# Patient Record
Sex: Female | Born: 1965 | Race: White | Hispanic: No | Marital: Married | Smoking: Never smoker
Health system: Southern US, Community
[De-identification: ages and names within clinical notes are randomized; demographics above are authoritative.]

## PROBLEM LIST (undated history)

## (undated) DIAGNOSIS — F419 Anxiety disorder, unspecified: Secondary | ICD-10-CM

## (undated) DIAGNOSIS — Z789 Other specified health status: Secondary | ICD-10-CM

## (undated) HISTORY — PX: NO PAST SURGERIES: SHX2092

## (undated) HISTORY — PX: LASIK: SHX215

## (undated) HISTORY — PX: ABDOMINAL HYSTERECTOMY: SHX81

---

## 1998-09-14 ENCOUNTER — Other Ambulatory Visit: Admission: RE | Admit: 1998-09-14 | Discharge: 1998-09-14 | Payer: Self-pay | Admitting: Obstetrics and Gynecology

## 1998-11-27 ENCOUNTER — Other Ambulatory Visit: Admission: RE | Admit: 1998-11-27 | Discharge: 1998-11-27 | Payer: Self-pay | Admitting: Obstetrics and Gynecology

## 1998-11-27 ENCOUNTER — Encounter (INDEPENDENT_AMBULATORY_CARE_PROVIDER_SITE_OTHER): Payer: Self-pay | Admitting: *Deleted

## 1999-09-30 ENCOUNTER — Other Ambulatory Visit: Admission: RE | Admit: 1999-09-30 | Discharge: 1999-09-30 | Payer: Self-pay | Admitting: Obstetrics and Gynecology

## 2002-11-16 ENCOUNTER — Other Ambulatory Visit: Admission: RE | Admit: 2002-11-16 | Discharge: 2002-11-16 | Payer: Self-pay | Admitting: Obstetrics and Gynecology

## 2003-11-28 ENCOUNTER — Other Ambulatory Visit: Admission: RE | Admit: 2003-11-28 | Discharge: 2003-11-28 | Payer: Self-pay | Admitting: Obstetrics and Gynecology

## 2004-11-13 ENCOUNTER — Encounter: Admission: RE | Admit: 2004-11-13 | Discharge: 2004-11-13 | Payer: Self-pay | Admitting: Obstetrics and Gynecology

## 2005-01-15 ENCOUNTER — Other Ambulatory Visit: Admission: RE | Admit: 2005-01-15 | Discharge: 2005-01-15 | Payer: Self-pay | Admitting: Obstetrics and Gynecology

## 2006-01-16 ENCOUNTER — Encounter: Admission: RE | Admit: 2006-01-16 | Discharge: 2006-01-16 | Payer: Self-pay | Admitting: Obstetrics and Gynecology

## 2006-01-19 ENCOUNTER — Other Ambulatory Visit: Admission: RE | Admit: 2006-01-19 | Discharge: 2006-01-19 | Payer: Self-pay | Admitting: Obstetrics and Gynecology

## 2007-01-18 ENCOUNTER — Encounter: Admission: RE | Admit: 2007-01-18 | Discharge: 2007-01-18 | Payer: Self-pay | Admitting: Obstetrics and Gynecology

## 2007-01-21 ENCOUNTER — Other Ambulatory Visit: Admission: RE | Admit: 2007-01-21 | Discharge: 2007-01-21 | Payer: Self-pay | Admitting: Obstetrics and Gynecology

## 2008-01-19 ENCOUNTER — Encounter: Admission: RE | Admit: 2008-01-19 | Discharge: 2008-01-19 | Payer: Self-pay | Admitting: Obstetrics and Gynecology

## 2008-01-24 ENCOUNTER — Other Ambulatory Visit: Admission: RE | Admit: 2008-01-24 | Discharge: 2008-01-24 | Payer: Self-pay | Admitting: Obstetrics and Gynecology

## 2009-01-22 ENCOUNTER — Encounter: Admission: RE | Admit: 2009-01-22 | Discharge: 2009-01-22 | Payer: Self-pay | Admitting: Obstetrics and Gynecology

## 2009-01-24 ENCOUNTER — Other Ambulatory Visit: Admission: RE | Admit: 2009-01-24 | Discharge: 2009-01-24 | Payer: Self-pay | Admitting: Obstetrics and Gynecology

## 2010-01-23 ENCOUNTER — Encounter
Admission: RE | Admit: 2010-01-23 | Discharge: 2010-01-23 | Payer: Self-pay | Source: Home / Self Care | Admitting: Obstetrics and Gynecology

## 2010-01-24 ENCOUNTER — Other Ambulatory Visit
Admission: RE | Admit: 2010-01-24 | Discharge: 2010-01-24 | Payer: Self-pay | Source: Home / Self Care | Admitting: Obstetrics and Gynecology

## 2010-12-25 ENCOUNTER — Other Ambulatory Visit: Payer: Self-pay | Admitting: Obstetrics and Gynecology

## 2010-12-25 DIAGNOSIS — Z1231 Encounter for screening mammogram for malignant neoplasm of breast: Secondary | ICD-10-CM

## 2011-01-30 ENCOUNTER — Ambulatory Visit: Payer: Self-pay

## 2011-02-05 ENCOUNTER — Ambulatory Visit
Admission: RE | Admit: 2011-02-05 | Discharge: 2011-02-05 | Disposition: A | Discharge status: Home / Self Care | Payer: PRIVATE HEALTH INSURANCE | Source: Ambulatory Visit | Attending: Obstetrics and Gynecology | Admitting: Obstetrics and Gynecology

## 2011-02-05 DIAGNOSIS — Z1231 Encounter for screening mammogram for malignant neoplasm of breast: Secondary | ICD-10-CM

## 2011-02-06 ENCOUNTER — Other Ambulatory Visit: Payer: Self-pay | Admitting: Nurse Practitioner

## 2011-02-06 ENCOUNTER — Other Ambulatory Visit (HOSPITAL_COMMUNITY)
Admission: RE | Admit: 2011-02-06 | Discharge: 2011-02-06 | Disposition: A | Discharge status: Home / Self Care | Payer: PRIVATE HEALTH INSURANCE | Source: Ambulatory Visit | Attending: Obstetrics and Gynecology | Admitting: Obstetrics and Gynecology

## 2011-02-06 DIAGNOSIS — Z01419 Encounter for gynecological examination (general) (routine) without abnormal findings: Secondary | ICD-10-CM | POA: Insufficient documentation

## 2011-02-06 DIAGNOSIS — Z1159 Encounter for screening for other viral diseases: Secondary | ICD-10-CM | POA: Insufficient documentation

## 2011-12-30 ENCOUNTER — Other Ambulatory Visit: Payer: Self-pay | Admitting: Obstetrics and Gynecology

## 2011-12-30 DIAGNOSIS — Z1231 Encounter for screening mammogram for malignant neoplasm of breast: Secondary | ICD-10-CM

## 2012-02-06 ENCOUNTER — Ambulatory Visit: Payer: PRIVATE HEALTH INSURANCE

## 2012-02-25 ENCOUNTER — Ambulatory Visit
Admission: RE | Admit: 2012-02-25 | Discharge: 2012-02-25 | Disposition: A | Discharge status: Home / Self Care | Payer: 59 | Source: Ambulatory Visit | Attending: Obstetrics and Gynecology | Admitting: Obstetrics and Gynecology

## 2012-02-25 DIAGNOSIS — Z1231 Encounter for screening mammogram for malignant neoplasm of breast: Secondary | ICD-10-CM

## 2013-01-20 ENCOUNTER — Other Ambulatory Visit: Payer: Self-pay

## 2013-01-20 DIAGNOSIS — Z1231 Encounter for screening mammogram for malignant neoplasm of breast: Secondary | ICD-10-CM

## 2013-02-25 ENCOUNTER — Ambulatory Visit
Admission: RE | Admit: 2013-02-25 | Discharge: 2013-02-25 | Disposition: A | Discharge status: Home / Self Care | Payer: 59 | Source: Ambulatory Visit

## 2013-02-25 DIAGNOSIS — Z1231 Encounter for screening mammogram for malignant neoplasm of breast: Secondary | ICD-10-CM

## 2013-03-15 ENCOUNTER — Encounter (HOSPITAL_COMMUNITY): Payer: Self-pay | Admitting: Pharmacist

## 2013-03-23 ENCOUNTER — Encounter (HOSPITAL_COMMUNITY)
Admission: RE | Admit: 2013-03-23 | Discharge: 2013-03-23 | Disposition: A | Discharge status: Home / Self Care | Payer: 59 | Source: Ambulatory Visit | Attending: Obstetrics and Gynecology | Admitting: Obstetrics and Gynecology

## 2013-03-23 ENCOUNTER — Encounter (HOSPITAL_COMMUNITY): Payer: Self-pay

## 2013-03-23 DIAGNOSIS — Z01812 Encounter for preprocedural laboratory examination: Secondary | ICD-10-CM | POA: Insufficient documentation

## 2013-03-23 HISTORY — DX: Anxiety disorder, unspecified: F41.9

## 2013-03-23 LAB — CBC
HCT: 39.1 % (ref 36.0–46.0)
Hemoglobin: 13.7 g/dL (ref 12.0–15.0)
MCH: 29 pg (ref 26.0–34.0)
MCHC: 35 g/dL (ref 30.0–36.0)
MCV: 82.7 fL (ref 78.0–100.0)
PLATELETS: 203 10*3/uL (ref 150–400)
RBC: 4.73 MIL/uL (ref 3.87–5.11)
RDW: 12.8 % (ref 11.5–15.5)
WBC: 5.8 10*3/uL (ref 4.0–10.5)

## 2013-03-23 NOTE — Patient Instructions (Signed)
[place]  03/23/2013   Your procedure is scheduled on:  March 30, 2012  Enter through the [employer] [facility] at [place] up the phone at the desk and dial [phone].   Call this number if you have problems the morning of surgery: [phone]   Remember:   Do not eat food: after midnight  Do not drink clear liquids: 4 Hours before arrival.  Take these medicines the morning of surgery with A SIP OF WATER: no medications   Do not wear jewelry, make-up or nail polish.  Do not wear lotions, powders, or perfumes. You may wear deodorant.  Do not shave 48 hours prior to surgery.  Do not bring valuables to the hospital.  [facility] is not   responsible for any belongings or valuables brought to the hospital.  Contacts, dentures or bridgework may not be worn into surgery.  Leave suitcase in the car. After surgery it may be brought to your room.  For patients admitted to the hospital, checkout time is 11:00 AM the day of              discharge.   Patients discharged the day of surgery will not be allowed to drive             home.  Name and phone number of your driver: Kristen Buck [phone]  Special Instructions:   Shower using CHG 2 nights before surgery and the night before surgery.  If you shower the day of surgery use CHG.  Use special wash - you have one bottle of CHG for all showers.  You should use approximately 1/3 of the bottle for each shower.

## 2013-03-28 ENCOUNTER — Other Ambulatory Visit: Payer: Self-pay | Admitting: Obstetrics and Gynecology

## 2013-03-28 NOTE — H&P (Signed)
Chief Complaint(s):   PreOp for 03/30/13   HPI:  General 48 y/o presents for preop history and phyical . she is complaining of uterine prolapse. she has difficulty emptying her bladder and having bowel movements at times. she takes miralax daily for constipation. she is interested in some form of treatment for the prolapse  she has not had a menses since 06/2012. She has occasional hot flashes.  Current Medication:  Taking  Calcium 600-D 600-400 MG-UNIT Tablet 1 tablet Once a day     Vitamin D 2000 UNIT Capsule as directed     Vitamin C 500 MG Tablet Chewable as directed     MVI as directed     MiraLax Powder as directed     OTC , Notes: Juice plus vitamin     Citalopram Hydrobromide 40 MG Tablet 1 tablet Once a day     Doxycycline Hyclate 100 MG Capsule 1 capsule Once a day     Clindamycin Phos-Benzoyl Perox 1.2-5 % Gel     Colace 100 MG Capsule 1 capsule as needed Once a day     Medication List reviewed and reconciled with the patient   Medical History:   depression     overweight   Allergies/Intolerance:   N.K.D.A.   Gyn History:   Sexual activity currently sexually active. Periods : as of 03/23/13 no period x 7 months. LMP 06/2012. Birth control vasectomy. Last pap smear date 02/06/11 Negative. Last mammogram date 02/25/2013. Denies Abnormal pap smear. Denies STD.   OB History:   OB History G3. Number of pregnancies 3. Pregnancy # 1 miscarriage. Pregnancy # 2 live birth, boy, vaginal delivery. Pregnancy # 3 live birth, vaginal delivery, girl.   Surgical History:   No Surgical History documented.   Hospitalization:   childbirth x 2 SVD   Family History:   Father: alive, hypertension    Mother: alive, hypertension    Paternal [place] Father: deceased, hypertension, heart attack    Paternal Grand Mother: deceased    Maternal Grand Father: deceased, hypertension    Maternal Grand Mother: deceased    1 son(s) , 1 daughter(s) .    No family history of breast, colon,  prostate or ovarian cancer.  Social History:  General Tobacco use cigarettes: Never smoked, Tobacco history last updated 02/28/2013.  no Smoking.  no Alcohol.  Caffeine: yes, coffee, tea.  no Recreational drug use.  Exercise: minimal.  Occupation: employed, [employer].  Marital Status: married.  Children: 1, Boys, 1, girls.  no Domestic Violence.  ROS: Negative except as stated in hPI.   Objective:  Vitals:  Wt 160, Wt change 1 lb, Temp 96.7, Pulse sitting 70, BP sitting 114/71  Past Results:  Examination:  General Examination alert, oriented, NAD" categoryPropId="[id number]" examid="[id number]"GENERAL APPEARANCE alert, oriented, NAD.  clear to auscultation bilaterally" categoryPropId="87" examid="[id number]"LUNGS: clear to auscultation bilaterally.  regular rate and rhythm" categoryPropId="86" examid="[id number]"HEART: regular rate and rhythm.  soft, non-tender/non-distended, bowel sounds present" categoryPropId="88" examid="[id number]"ABDOMEN: soft, non-tender/non-distended, bowel sounds present.  normal external genitalia, labia - unremarkable, vagina - pink moist mucosa, no lesions or abnormal discharge Grade 3 uterine prolapse with valsalva.. small cystocele.. moderate rectocele , cervix - no discharge or lesions or CMT, adnexa - no masses or tenderness, uterus - nontender and normal size on palpation" categoryPropId="[id number]" examid="[id number]"FEMALE GENITOURINARY: normal external genitalia, labia - unremarkable, vagina - pink moist mucosa, no lesions or abnormal discharge Grade 3 uterine prolapse with valsalva.. small cystocele.. moderate rectocele , cervix - no discharge or lesions  or CMT, adnexa - no masses or tenderness, uterus - nontender and normal size on palpation.  no edema present" categoryPropId="89" examid="[id number]"EXTREMITIES: no edema present.    Assessment:  Uterine prolapse - 618.1 (Primary)     Cystocele - 618.01     Rectocele - 618.04     Plan:  Treatment:  Uterine  prolapse  Notes: pt desires definitive therapy via vaginal hysterectomy and bso with rectocele repair. and possible anterior repair... r/b/a/ of surgery were discussed with the patient including but not limited to infection / bleeding / damage to bowel bladder ureters and surrounding organs with the need for further surgery. pt voiced understanding and desires to proceed with the above surgery.  Procedures:  Immunizations:  Therapeutic Injections:  Diagnostic Imaging:  Lab Reports:  Preventive Medicine:    Next Appointment:   3 Weeks (Reason: postoperative visit )

## 2013-03-30 ENCOUNTER — Ambulatory Visit (HOSPITAL_COMMUNITY): Payer: 59 | Admitting: Anesthesiology

## 2013-03-30 ENCOUNTER — Observation Stay (HOSPITAL_COMMUNITY)
Admission: RE | Admit: 2013-03-30 | Discharge: 2013-03-31 | Disposition: A | Discharge status: Home / Self Care | Payer: 59 | Source: Ambulatory Visit | Attending: Obstetrics and Gynecology | Admitting: Obstetrics and Gynecology

## 2013-03-30 ENCOUNTER — Encounter (HOSPITAL_COMMUNITY)
Admission: RE | Disposition: A | Discharge status: Home / Self Care | Payer: Self-pay | Source: Ambulatory Visit | Attending: Obstetrics and Gynecology

## 2013-03-30 ENCOUNTER — Encounter (HOSPITAL_COMMUNITY): Payer: Self-pay | Admitting: *Deleted

## 2013-03-30 ENCOUNTER — Encounter (HOSPITAL_COMMUNITY): Payer: 59 | Admitting: Anesthesiology

## 2013-03-30 DIAGNOSIS — N814 Uterovaginal prolapse, unspecified: Principal | ICD-10-CM | POA: Insufficient documentation

## 2013-03-30 DIAGNOSIS — N8000 Endometriosis of the uterus, unspecified: Secondary | ICD-10-CM | POA: Insufficient documentation

## 2013-03-30 DIAGNOSIS — D252 Subserosal leiomyoma of uterus: Secondary | ICD-10-CM | POA: Insufficient documentation

## 2013-03-30 DIAGNOSIS — N80109 Endometriosis of ovary, unspecified side, unspecified depth: Secondary | ICD-10-CM | POA: Insufficient documentation

## 2013-03-30 DIAGNOSIS — Z90710 Acquired absence of both cervix and uterus: Secondary | ICD-10-CM | POA: Diagnosis present

## 2013-03-30 DIAGNOSIS — N801 Endometriosis of ovary: Secondary | ICD-10-CM | POA: Insufficient documentation

## 2013-03-30 HISTORY — DX: Other specified health status: Z78.9

## 2013-03-30 HISTORY — PX: SALPINGOOPHORECTOMY: SHX82

## 2013-03-30 HISTORY — PX: RECTOCELE REPAIR: SHX761

## 2013-03-30 HISTORY — PX: VAGINAL HYSTERECTOMY: SHX2639

## 2013-03-30 LAB — TYPE AND SCREEN
ABO/RH(D): O POS
ANTIBODY SCREEN: NEGATIVE

## 2013-03-30 LAB — ABO/RH: ABO/RH(D): O POS

## 2013-03-30 SURGERY — HYSTERECTOMY, VAGINAL
Anesthesia: General | Site: Vagina

## 2013-03-30 MED ORDER — ESTRADIOL 0.1 MG/GM VA CREA
TOPICAL_CREAM | VAGINAL | Status: DC | PRN
  Administered 2013-03-30: 1 via VAGINAL

## 2013-03-30 MED ORDER — HYDROMORPHONE HCL PF 1 MG/ML IJ SOLN
INTRAMUSCULAR | Status: DC | PRN
  Administered 2013-03-30 (×2): 0.5 mg via INTRAVENOUS

## 2013-03-30 MED ORDER — HYDROMORPHONE 0.3 MG/ML IV SOLN
INTRAVENOUS | Status: DC
  Administered 2013-03-30: 0.3 mL via INTRAVENOUS
  Administered 2013-03-30 – 2013-03-31 (×3): 0.3 mg via INTRAVENOUS
  Filled 2013-03-30: qty 25

## 2013-03-30 MED ORDER — MEPERIDINE HCL 25 MG/ML IJ SOLN: 6.2500 mg | INTRAMUSCULAR | Status: DC | PRN

## 2013-03-30 MED ORDER — PROPOFOL 10 MG/ML IV EMUL
INTRAVENOUS | Status: AC
  Filled 2013-03-30: qty 20

## 2013-03-30 MED ORDER — CEFAZOLIN SODIUM-DEXTROSE 2-3 GM-% IV SOLR: 2.0000 g | INTRAVENOUS | Status: DC

## 2013-03-30 MED ORDER — PROMETHAZINE HCL 25 MG/ML IJ SOLN: 6.2500 mg | INTRAMUSCULAR | Status: DC | PRN

## 2013-03-30 MED ORDER — LACTATED RINGERS IV SOLN
INTRAVENOUS | Status: DC
  Administered 2013-03-30 (×3): via INTRAVENOUS

## 2013-03-30 MED ORDER — ONDANSETRON HCL 4 MG/2ML IJ SOLN
INTRAMUSCULAR | Status: AC
  Filled 2013-03-30: qty 2

## 2013-03-30 MED ORDER — ONDANSETRON HCL 4 MG PO TABS: 4.0000 mg | ORAL_TABLET | Freq: Four times a day (QID) | ORAL | Status: DC | PRN

## 2013-03-30 MED ORDER — MIDAZOLAM HCL 2 MG/2ML IJ SOLN
INTRAMUSCULAR | Status: DC | PRN
  Administered 2013-03-30: 2 mg via INTRAVENOUS

## 2013-03-30 MED ORDER — NEOSTIGMINE METHYLSULFATE 1 MG/ML IJ SOLN
INTRAMUSCULAR | Status: AC
  Filled 2013-03-30: qty 1

## 2013-03-30 MED ORDER — HYDROMORPHONE HCL PF 1 MG/ML IJ SOLN
INTRAMUSCULAR | Status: AC
Start: 2013-03-30 — End: 2013-03-30
  Filled 2013-03-30: qty 1

## 2013-03-30 MED ORDER — KETOROLAC TROMETHAMINE 30 MG/ML IJ SOLN: 15.0000 mg | Freq: Once | INTRAMUSCULAR | Status: DC | PRN

## 2013-03-30 MED ORDER — LACTATED RINGERS IV SOLN
INTRAVENOUS | Status: DC
  Administered 2013-03-30: 17:00:00 via INTRAVENOUS

## 2013-03-30 MED ORDER — ROCURONIUM BROMIDE 100 MG/10ML IV SOLN
INTRAVENOUS | Status: DC | PRN
  Administered 2013-03-30: 45 mg via INTRAVENOUS
  Administered 2013-03-30: 5 mg via INTRAVENOUS
  Administered 2013-03-30 (×2): 10 mg via INTRAVENOUS

## 2013-03-30 MED ORDER — ONDANSETRON HCL 4 MG/2ML IJ SOLN
4.0000 mg | Freq: Four times a day (QID) | INTRAMUSCULAR | Status: DC | PRN
Start: 2013-03-30 — End: 2013-03-31

## 2013-03-30 MED ORDER — DOXYCYCLINE HYCLATE 100 MG PO TABS
100.0000 mg | ORAL_TABLET | Freq: Two times a day (BID) | ORAL | Status: DC
  Administered 2013-03-30: 100 mg via ORAL
  Filled 2013-03-30 (×2): qty 1

## 2013-03-30 MED ORDER — NALOXONE HCL 0.4 MG/ML IJ SOLN: 0.4000 mg | INTRAMUSCULAR | Status: DC | PRN

## 2013-03-30 MED ORDER — DIPHENHYDRAMINE HCL 12.5 MG/5ML PO ELIX
12.5000 mg | ORAL_SOLUTION | Freq: Four times a day (QID) | ORAL | Status: DC | PRN
  Filled 2013-03-30: qty 5

## 2013-03-30 MED ORDER — LIDOCAINE-EPINEPHRINE 1 %-1:100000 IJ SOLN
INTRAMUSCULAR | Status: DC | PRN
  Administered 2013-03-30: 48 mL

## 2013-03-30 MED ORDER — LIDOCAINE-EPINEPHRINE 1 %-1:100000 IJ SOLN
INTRAMUSCULAR | Status: AC
Start: 2013-03-30 — End: 2013-03-30
  Filled 2013-03-30: qty 1

## 2013-03-30 MED ORDER — PROPOFOL 10 MG/ML IV BOLUS
INTRAVENOUS | Status: DC | PRN
  Administered 2013-03-30: 180 mg via INTRAVENOUS

## 2013-03-30 MED ORDER — ALUM & MAG HYDROXIDE-SIMETH 200-200-20 MG/5ML PO SUSP: 30.0000 mL | ORAL | Status: DC | PRN

## 2013-03-30 MED ORDER — FENTANYL CITRATE 0.05 MG/ML IJ SOLN
INTRAMUSCULAR | Status: AC
  Filled 2013-03-30: qty 5

## 2013-03-30 MED ORDER — ZOLPIDEM TARTRATE 5 MG PO TABS: 5.0000 mg | ORAL_TABLET | Freq: Every evening | ORAL | Status: DC | PRN

## 2013-03-30 MED ORDER — FENTANYL CITRATE 0.05 MG/ML IJ SOLN
INTRAMUSCULAR | Status: DC | PRN
  Administered 2013-03-30: 100 ug via INTRAVENOUS
  Administered 2013-03-30 (×3): 50 ug via INTRAVENOUS

## 2013-03-30 MED ORDER — SIMETHICONE 80 MG PO CHEW: 80.0000 mg | CHEWABLE_TABLET | Freq: Four times a day (QID) | ORAL | Status: DC | PRN

## 2013-03-30 MED ORDER — DOXYCYCLINE HYCLATE 100 MG PO CAPS: 100.0000 mg | ORAL_CAPSULE | Freq: Two times a day (BID) | ORAL | Status: DC

## 2013-03-30 MED ORDER — LIDOCAINE-EPINEPHRINE 1 %-1:100000 IJ SOLN
INTRAMUSCULAR | Status: AC
  Filled 2013-03-30: qty 2

## 2013-03-30 MED ORDER — CITALOPRAM HYDROBROMIDE 40 MG PO TABS
40.0000 mg | ORAL_TABLET | Freq: Every day | ORAL | Status: DC
  Administered 2013-03-30: 40 mg via ORAL
  Filled 2013-03-30: qty 1

## 2013-03-30 MED ORDER — DIPHENHYDRAMINE HCL 50 MG/ML IJ SOLN: 12.5000 mg | Freq: Four times a day (QID) | INTRAMUSCULAR | Status: DC | PRN

## 2013-03-30 MED ORDER — CEFAZOLIN SODIUM-DEXTROSE 2-3 GM-% IV SOLR
INTRAVENOUS | Status: AC
  Administered 2013-03-30: 2 g via INTRAVENOUS
  Filled 2013-03-30: qty 50

## 2013-03-30 MED ORDER — PANTOPRAZOLE SODIUM 40 MG IV SOLR
40.0000 mg | Freq: Every day | INTRAVENOUS | Status: DC
  Administered 2013-03-30: 40 mg via INTRAVENOUS
  Filled 2013-03-30: qty 40

## 2013-03-30 MED ORDER — GLYCOPYRROLATE 0.2 MG/ML IJ SOLN
INTRAMUSCULAR | Status: DC | PRN
  Administered 2013-03-30: .5 mg via INTRAVENOUS
  Administered 2013-03-30: 0.1 mg via INTRAVENOUS

## 2013-03-30 MED ORDER — 0.9 % SODIUM CHLORIDE (POUR BTL) OPTIME
TOPICAL | Status: DC | PRN
  Administered 2013-03-30: 1000 mL

## 2013-03-30 MED ORDER — NEOSTIGMINE METHYLSULFATE 1 MG/ML IJ SOLN
INTRAMUSCULAR | Status: DC | PRN
  Administered 2013-03-30: 2.5 mg via INTRAVENOUS

## 2013-03-30 MED ORDER — GLYCOPYRROLATE 0.2 MG/ML IJ SOLN
INTRAMUSCULAR | Status: AC
  Filled 2013-03-30: qty 1

## 2013-03-30 MED ORDER — DEXAMETHASONE SODIUM PHOSPHATE 10 MG/ML IJ SOLN
INTRAMUSCULAR | Status: DC | PRN
  Administered 2013-03-30: 10 mg via INTRAVENOUS

## 2013-03-30 MED ORDER — ONDANSETRON HCL 4 MG/2ML IJ SOLN
INTRAMUSCULAR | Status: DC | PRN
  Administered 2013-03-30: 4 mg via INTRAVENOUS

## 2013-03-30 MED ORDER — ONDANSETRON HCL 4 MG/2ML IJ SOLN: 4.0000 mg | Freq: Four times a day (QID) | INTRAMUSCULAR | Status: DC | PRN

## 2013-03-30 MED ORDER — POLYETHYLENE GLYCOL 3350 17 G PO PACK
17.0000 g | PACK | Freq: Every day | ORAL | Status: DC
  Filled 2013-03-30 (×2): qty 1

## 2013-03-30 MED ORDER — OXYCODONE-ACETAMINOPHEN 5-325 MG PO TABS
1.0000 | ORAL_TABLET | ORAL | Status: DC | PRN
  Administered 2013-03-31: 1 via ORAL
  Filled 2013-03-30: qty 1

## 2013-03-30 MED ORDER — SODIUM CHLORIDE 0.9 % IJ SOLN: 9.0000 mL | INTRAMUSCULAR | Status: DC | PRN

## 2013-03-30 MED ORDER — DEXAMETHASONE SODIUM PHOSPHATE 10 MG/ML IJ SOLN
INTRAMUSCULAR | Status: AC
  Filled 2013-03-30: qty 1

## 2013-03-30 MED ORDER — SENNA 8.6 MG PO TABS
1.0000 | ORAL_TABLET | Freq: Two times a day (BID) | ORAL | Status: DC
  Administered 2013-03-30: 8.6 mg via ORAL
  Filled 2013-03-30 (×2): qty 1

## 2013-03-30 MED ORDER — LIDOCAINE HCL (CARDIAC) 20 MG/ML IV SOLN
INTRAVENOUS | Status: AC
  Filled 2013-03-30: qty 5

## 2013-03-30 MED ORDER — HYDROMORPHONE HCL PF 1 MG/ML IJ SOLN: 0.2500 mg | INTRAMUSCULAR | Status: DC | PRN

## 2013-03-30 MED ORDER — LIDOCAINE HCL (CARDIAC) 20 MG/ML IV SOLN
INTRAVENOUS | Status: DC | PRN
  Administered 2013-03-30: 60 mg via INTRAVENOUS

## 2013-03-30 MED ORDER — KETOROLAC TROMETHAMINE 30 MG/ML IJ SOLN: 30.0000 mg | Freq: Four times a day (QID) | INTRAMUSCULAR | Status: DC

## 2013-03-30 MED ORDER — MIDAZOLAM HCL 2 MG/2ML IJ SOLN
INTRAMUSCULAR | Status: AC
  Filled 2013-03-30: qty 2

## 2013-03-30 MED ORDER — ROCURONIUM BROMIDE 100 MG/10ML IV SOLN
INTRAVENOUS | Status: AC
  Filled 2013-03-30: qty 1

## 2013-03-30 MED ORDER — KETOROLAC TROMETHAMINE 30 MG/ML IJ SOLN
INTRAMUSCULAR | Status: DC | PRN
  Administered 2013-03-30: 30 mg via INTRAVENOUS

## 2013-03-30 MED ORDER — ESTRADIOL 0.1 MG/GM VA CREA
TOPICAL_CREAM | VAGINAL | Status: AC
  Filled 2013-03-30: qty 42.5

## 2013-03-30 MED ORDER — KETOROLAC TROMETHAMINE 30 MG/ML IJ SOLN
30.0000 mg | Freq: Four times a day (QID) | INTRAMUSCULAR | Status: DC
  Administered 2013-03-30 – 2013-03-31 (×2): 30 mg via INTRAVENOUS
  Filled 2013-03-30 (×2): qty 1

## 2013-03-30 MED ORDER — IBUPROFEN 800 MG PO TABS: 800.0000 mg | ORAL_TABLET | Freq: Three times a day (TID) | ORAL | Status: DC | PRN

## 2013-03-30 SURGICAL SUPPLY — 37 items
CANISTER SUCT 3000ML (MISCELLANEOUS) ×4 IMPLANT
CLOTH BEACON ORANGE TIMEOUT ST (SAFETY) ×4 IMPLANT
CONT PATH 16OZ SNAP LID 3702 (MISCELLANEOUS) ×2 IMPLANT
CONTAINER PREFILL 10% NBF 60ML (FORM) ×4 IMPLANT
DECANTER SPIKE VIAL GLASS SM (MISCELLANEOUS) ×6 IMPLANT
DRAPE PROXIMA HALF (DRAPES) ×2 IMPLANT
DRAPE STERI URO 9X17 APER PCH (DRAPES) ×4 IMPLANT
DRSG TELFA 3X8 NADH (GAUZE/BANDAGES/DRESSINGS) ×4 IMPLANT
ELECT LIGASURE LONG (ELECTRODE) IMPLANT
ELECT LIGASURE SHORT 9 REUSE (ELECTRODE) ×2 IMPLANT
GAUZE PACKING 1 X5 YD ST (GAUZE/BANDAGES/DRESSINGS) ×2 IMPLANT
GAUZE PACKING 2X5 YD STRL (GAUZE/BANDAGES/DRESSINGS) IMPLANT
GLOVE BIOGEL M 6.5 STRL (GLOVE) ×8 IMPLANT
GLOVE BIOGEL PI IND STRL 6.5 (GLOVE) ×6 IMPLANT
GLOVE BIOGEL PI INDICATOR 6.5 (GLOVE) ×2
GOWN STRL REUS W/TWL LRG LVL3 (GOWN DISPOSABLE) ×12 IMPLANT
GOWN STRL REUS W/TWL XL LVL3 (GOWN DISPOSABLE) ×4 IMPLANT
NDL SPNL 22GX3.5 QUINCKE BK (NEEDLE) IMPLANT
NEEDLE HYPO 22GX1.5 SAFETY (NEEDLE) IMPLANT
NEEDLE SPNL 22GX3.5 QUINCKE BK (NEEDLE) IMPLANT
NS IRRIG 1000ML POUR BTL (IV SOLUTION) ×4 IMPLANT
PACK VAGINAL WOMENS (CUSTOM PROCEDURE TRAY) ×4 IMPLANT
PAD DRESSING TELFA 3X8 NADH (GAUZE/BANDAGES/DRESSINGS) ×2 IMPLANT
PAD OB MATERNITY 4.3X12.25 (PERSONAL CARE ITEMS) ×4 IMPLANT
SET CYSTO W/LG BORE CLAMP LF (SET/KITS/TRAYS/PACK) IMPLANT
SUT CHROMIC 2 0 CT 1 (SUTURE) ×8 IMPLANT
SUT VIC AB 0 CT1 18XCR BRD8 (SUTURE) ×9 IMPLANT
SUT VIC AB 0 CT1 36 (SUTURE) ×8 IMPLANT
SUT VIC AB 0 CT1 8-18 (SUTURE) ×12
SUT VIC AB 2-0 CT1 (SUTURE) IMPLANT
SUT VIC AB 2-0 CT2 27 (SUTURE) ×18 IMPLANT
SUT VIC AB 2-0 SH 27 (SUTURE) ×4
SUT VIC AB 2-0 SH 27XBRD (SUTURE) ×1 IMPLANT
SUT VICRYL 1 TIES 12X18 (SUTURE) ×4 IMPLANT
TOWEL OR 17X24 6PK STRL BLUE (TOWEL DISPOSABLE) ×8 IMPLANT
TRAY FOLEY CATH 14FR (SET/KITS/TRAYS/PACK) ×4 IMPLANT
WATER STERILE IRR 1000ML POUR (IV SOLUTION) ×2 IMPLANT

## 2013-03-30 NOTE — Anesthesia Preprocedure Evaluation (Addendum)
Anesthesia Evaluation  Patient identified by MRN, date of birth, ID band Patient awake    Reviewed: Allergy & Precautions, H&P , NPO status , Patient's Chart, lab work & pertinent test results  Airway Mallampati: I TM Distance: >3 FB Neck ROM: full    Dental no notable dental hx. (+) Teeth Intact   Pulmonary neg pulmonary ROS,          Cardiovascular negative cardio ROS      Neuro/Psych PSYCHIATRIC DISORDERS Anxiety negative neurological ROS  negative psych ROS   GI/Hepatic negative GI ROS, Neg liver ROS,   Endo/Other  negative endocrine ROS  Renal/GU negative Renal ROS     Musculoskeletal negative musculoskeletal ROS (+)   Abdominal Normal abdominal exam  (+)   Peds  Hematology negative hematology ROS (+)   Anesthesia Other Findings   Reproductive/Obstetrics negative OB ROS                           Anesthesia Physical Anesthesia Plan  ASA: II  Anesthesia Plan: General   Post-op Pain Management:    Induction: Intravenous  Airway Management Planned: Oral ETT  Additional Equipment:   Intra-op Plan:   Post-operative Plan: Extubation in OR  Informed Consent: I have reviewed the patients History and Physical, chart, labs and discussed the procedure including the risks, benefits and alternatives for the proposed anesthesia with the patient or authorized representative who has indicated his/her understanding and acceptance.   Dental Advisory Given  Plan Discussed with: CRNA and Surgeon  Anesthesia Plan Comments:         Anesthesia Quick Evaluation

## 2013-03-30 NOTE — Anesthesia Postprocedure Evaluation (Signed)
  Anesthesia Post Note  Patient: Kristen Buck  Procedure(s) Performed: Procedure(s) (LRB): HYSTERECTOMY VAGINAL (N/A) BILATERAL SALPINGO OOPHORECTOMY (Bilateral) POSTERIOR REPAIR (RECTOCELE) (N/A)  Anesthesia type: GA  Patient location: PACU  Post pain: Pain level controlled  Post assessment: Post-op Vital signs reviewed  Last Vitals:  Filed Vitals:   03/30/13 1530  BP: 107/52  Pulse: 86  Temp:   Resp: 12    Post vital signs: Reviewed  Level of consciousness: sedated  Complications: No apparent anesthesia complications

## 2013-03-30 NOTE — Anesthesia Procedure Notes (Signed)
Procedure Name: Intubation Date/Time: 03/30/2013 12:28 PM Performed by: Flossie Dibble Pre-anesthesia Checklist: Emergency Drugs available, Patient being monitored, Suction available, Patient identified and Timeout performed Patient Re-evaluated:Patient Re-evaluated prior to inductionOxygen Delivery Method: Circle system utilized Preoxygenation: Pre-oxygenation with 100% oxygen Intubation Type: IV induction Ventilation: Mask ventilation without difficulty Laryngoscope Size: Mac and 3 Grade View: Grade I Tube type: Oral Tube size: 7.0 mm Airway Equipment and Method: Stylet Placement Confirmation: ETT inserted through vocal cords under direct vision,  positive ETCO2 and breath sounds checked- equal and bilateral Secured at: 21 cm Tube secured with: Tape Dental Injury: Teeth and Oropharynx as per pre-operative assessment

## 2013-03-30 NOTE — Op Note (Signed)
Date of Initial H&P: 03/28/2013  History reviewed, patient examined, no change in status, stable for surgery.

## 2013-03-30 NOTE — Transfer of Care (Signed)
Immediate Anesthesia Transfer of Care Note  Patient: Kristen Buck  Procedure(s) Performed: Procedure(s): HYSTERECTOMY VAGINAL (N/A) BILATERAL SALPINGO OOPHORECTOMY (Bilateral) POSTERIOR REPAIR (RECTOCELE) (N/A)  Patient Location: PACU  Anesthesia Type:General  Level of Consciousness: awake, alert  and oriented  Airway & Oxygen Therapy: Patient Spontanous Breathing and Patient connected to nasal cannula oxygen  Post-op Assessment: Report given to PACU RN and Post -op Vital signs reviewed and stable  Post vital signs: Reviewed and stable  Complications: No apparent anesthesia complications

## 2013-03-30 NOTE — Op Note (Signed)
03/30/2013  3:31 PM  PATIENT:  Kristen Buck  48 y.o. female  PRE-OPERATIVE DIAGNOSIS:  Cystocele/Rectocele/Uterine prolapse  POST-OPERATIVE DIAGNOSIS:  Cystocele/Rectocele/Uterine prolapse  PROCEDURE:  Procedure(s): HYSTERECTOMY VAGINAL (N/A) BILATERAL SALPINGO OOPHORECTOMY (Bilateral) POSTERIOR REPAIR (RECTOCELE) (N/A)  SURGEON:  Surgeon(s) and Role:    * Arihana Ambrocio J. Landry Mellow, MD - Primary    * Annalee Genta, DO - Assisting  PHYSICIAN ASSISTANT:   ASSISTANTS: Dr. Janyth Pupa    ANESTHESIA:   general  EBL:  Total I/O In: 2500 [I.V.:2500] Out: 575 [Urine:400; Blood:175]  BLOOD ADMINISTERED:none  DRAINS: Urinary Catheter (Foley)   LOCAL MEDICATIONS USED:  LIDOCAINE   SPECIMEN:  Source of Specimen:  uterus cervix bilateral fallopian tubes and ovaries   DISPOSITION OF SPECIMEN:  PATHOLOGY  COUNTS:  YES  TOURNIQUET:  * No tourniquets in log *  DICTATION: .Dragon Dictation  PLAN OF CARE: Admit for overnight observation  PATIENT DISPOSITION:  PACU - hemodynamically stable.   Delay start of Pharmacological VTE agent (>24hrs) due to surgical blood loss or risk of bleeding: not applicable   Findings: Grade 2 uterine prolapse. Normal fallopian tubes and ovaries.  Moderate rectocele.   Procedure: the patient was taken to the operating room placed under general anesthesia. Prepped and draped in the normal sterile fashion. A foley catheter was placed. A weighted speculum was placed in to the vagina and the cervix was grasped with a toothed tenaculum. The cervix was then injected circumferentially with 1% xylocaine with 1:100K of epinephrine. The cervix was then circumferentially incised with the bovie  and the bladder was dissected off the pubovesical cervical fascia. The anterior cul-de-sac as entered sharply. The same procedure was performed posteriorly and the posterior cu-lde-sac was entered sharply without difficulty. A heany clamp was placed over the uterosacral ligaments  bilaterally., These were transected and suture ligated with 0 vicryl. The cardinal ligaments were then clamped bilaterally  With ligasure cauterized  and transected. .   The uterine arteries and the broad ligament were then serially clamped with ligasure cauterized and  transected bilaterally. Excellent hemostasis was visualized. Both cornu were clamped with heany clamps, transected and the uterus was delivered. Theses pedicles were then suture ligated with excellent hemostasis.   OThe left fallopian tube and ovary was grasped with babcock clamp. The infundibul0pelvic ligament was clamped with ligasure cauterized and transected. This was repeated on the right fallopian tube and ovary.    A modified McCalls suture of 0 vicryl was placed to obliterate the posterior culdesac.  The vaginal cuff angles were closed with an angle suture of 0 vicryl and transfixed to the ipsilateral uterosacral ligaments. The remainder of the vaginal cuff was closed with 0 vicryl in a running locked fashion.  Attention was turned to the rectocele.  The posterior vagina mucosa was injected with lidocaine with epi. The perineum was incised.   The posterior vaginal mucosa was incised along the midline. The recotocele was issected fromt he vaginal mucosa via sharp and blunt dissection.  The rectocele was reduced and the rectovaginal fascia was reapproximated with 2-0 vicryl interrupted suture. Redundant vaginal mucosa was excised. The vaginal mucosa was reapproximated with 2--0 vicryl in running locked fashion. Vaginal packing with estrace cream was placed.   All instruments were removed from the vagina and the patient was taken to the recovery room awake and in stable condition.   Sponge lap and needle counts were correct times 2.

## 2013-03-31 ENCOUNTER — Encounter (HOSPITAL_COMMUNITY): Payer: Self-pay | Admitting: Obstetrics and Gynecology

## 2013-03-31 LAB — CBC
HEMATOCRIT: 32.5 % — AB (ref 36.0–46.0)
Hemoglobin: 11 g/dL — ABNORMAL LOW (ref 12.0–15.0)
MCH: 28.3 pg (ref 26.0–34.0)
MCHC: 33.8 g/dL (ref 30.0–36.0)
MCV: 83.5 fL (ref 78.0–100.0)
Platelets: 168 10*3/uL (ref 150–400)
RBC: 3.89 MIL/uL (ref 3.87–5.11)
RDW: 12.9 % (ref 11.5–15.5)
WBC: 9.1 10*3/uL (ref 4.0–10.5)

## 2013-03-31 MED ORDER — OXYCODONE-ACETAMINOPHEN 5-325 MG PO TABS: 1.0000 | ORAL_TABLET | ORAL | Status: DC | PRN

## 2013-03-31 MED ORDER — IBUPROFEN 200 MG PO TABS: 600.0000 mg | ORAL_TABLET | Freq: Four times a day (QID) | ORAL | Status: AC | PRN

## 2013-03-31 NOTE — Discharge Instructions (Signed)
Hysterectomy Care After These instructions give you information on caring for yourself after your procedure. Your doctor may also give you more specific instructions. Call your doctor if you have any problems or questions after your procedure. HOME CARE Healing takes time. You may have discomfort, tenderness, puffiness (swelling), and bruising at the wound site. This may last for 2 weeks. This is normal and will get better.  Only take medicine as told by your doctor.  Do not take aspirin.  Do not drive when taking pain medicine.  Exercise, lift objects, drive, and get back to daily activites as told by your doctor.  Get back to your normal diet and activities as told by your doctor.  Get plenty of rest and sleep.  Do not douche, use tampons, or have sex (intercourse) for at least 6 weeks or as told.  Change your bandages (dressings) as told by your doctor.  Take your temperature during the day.  Take showers for 2 to 3 weeks. Do not take baths.  Do not drink alcohol until your doctor says it is okay.  Take a medicine to help you poop (laxative) as told by your doctor. Try eating bran foods. Drink enough fluids to keep your pee (urine) clear or pale yellow.  Have someone help you at home for 1 to 2 weeks after your surgery.  Keep follow-up doctor visits as told. GET HELP RIGHT AWAY IF:   You have a fever.  You have bad belly (abdominal) pain.  You have chest pain.  You are short of breath.  You pass out (faint).  You have pain, puffiness, or redness of your leg.  You bleed a lot from your vagina and notice clumps of tissue (clots).  You have puffiness, redness, or pain where a tube was put in your vein (IV) or in the wound area.  You have yellowish-white fluid (pus) coming from the wound.  You have a bad smell coming from the wound or bandage.  Your wound pulls apart.  You feel dizzy or lightheaded.  You have pain or bleeding when you pee.  You keep having  watery poop (diarrhea).  You keep feeling sick to your stomach (nauseous) or keep throwing up (vomiting).  You have fluid (discharge) coming from your vagina.  You have a rash.  You have a reaction to your medicine.  You need stronger pain medicine. MAKE SURE YOU:  Understand these instructions.  Will watch your condition.  Will get help right away if you are not doing well or get worse. Document Released: 11/13/2007 Document Revised: 04/28/2011 Document Reviewed: 09/20/2010 [facility] Patient Information 2014 [place], [place].

## 2013-03-31 NOTE — Anesthesia Postprocedure Evaluation (Signed)
  Anesthesia Post-op Note  Patient: Kristen Buck  Procedure(s) Performed: Procedure(s): HYSTERECTOMY VAGINAL (N/A) BILATERAL SALPINGO OOPHORECTOMY (Bilateral) POSTERIOR REPAIR (RECTOCELE) (N/A)  Patient Location: [facility]  Anesthesia Type:General  Level of Consciousness: awake, alert  and oriented  Airway and Oxygen Therapy: Patient Spontanous Breathing  Post-op Pain: none  Post-op Assessment: Post-op Vital signs reviewed and Patient's Cardiovascular Status Stable  Post-op Vital Signs: Reviewed and stable  Complications: No apparent anesthesia complications

## 2013-03-31 NOTE — Progress Notes (Signed)
Patient's vaginal packing removed. Minimal drainage, few small clots present on packing, patient tolerated well.

## 2013-03-31 NOTE — Progress Notes (Signed)
Pt discharged home with husband... Condition stable... No equipment... Taken to car via wheelchair by V. Kalida, NT.

## 2013-03-31 NOTE — Addendum Note (Signed)
Addendum created 03/31/13 0747 by Jonna Munro, CRNA   Modules edited: Notes Section   Notes Section:  File: [id number]

## 2013-03-31 NOTE — Progress Notes (Signed)
Postoperative Note Day # 1  S:  Patient resting comfortable in bed.  Pain controlled with PCA.  Tolerating general. No flatus, no BM.  Ambulating without difficulty.  Foley and vaginal packing removed this am.  She denies n/v/f/c, SOB, or CP.  No acute complaints.  O: Temp:  [97.9 F (36.6 C)-99 F (37.2 C)] 97.9 F (36.6 C) (02/12 0532) Pulse Rate:  [67-106] 74 (02/12 0532) Resp:  [10-20] 16 (02/12 0532) BP: (106-133)/(52-74) 126/74 mmHg (02/12 0532) SpO2:  [95 %-100 %] 100 % (02/12 0532) Weight:  [71.668 kg (158 lb)] 71.668 kg (158 lb) (02/11 1631) Gen: A&Ox3, NAD CV: RRR, no MRG Resp: CTAB Abdomen: soft, appropriately tender, non-distended, BS quiet GU: Minimal vaginal bleeding noted on pad Ext: No edema, no calf tenderness bilaterally, SCDs in place  Labs:  Recent Labs  03/31/13 0510  HGB 11.0*    A/P: Pt is a 48 y.o. s/p TVH, BSO, posterior repair. POD #1  - Pain well controlled -GU: UOP is adequate, foley removed this am- pt to void today -GI: Tolerating general diet -Activity: encouraged sitting up to chair and ambulation as tolerated -Prophylaxis: SCDs while in bed -Labs: stable as above -Patient meeting postoperative milestones appropriately.  If pain remains well controlled and pt able to void freely will plan for discharge home later today.  Janyth Pupa, DO [phone] (pager) [phone] (office)

## 2013-04-07 NOTE — Discharge Summary (Signed)
Physician Discharge Summary  Patient ID: Kristen Buck MRN: [id number] DOB/AGE: January 22, 1966 48 y.o.  Admit date: 03/30/2013 Discharge date: 03/31/13 Admission Diagnoses: Cystocele/Rectocele/Uterine prolapse  Discharge Diagnoses: same Active Problems:   S/P vaginal hysterectomy   Discharged Condition: stable  Hospital Course:  48 y/o presents for surgical management of her prolapse as she has difficulty emptying her bladder and having bowel movements at times.  The patient underwent a TVH, BSO and posterior repair performed by Dr. Christophe Louis.  For information regarding the procedure, please see the operative report.  The patient did well post-operatively and met all milestones appropriately.  She was discharged home in stable condition on POD #1.  Consults: None  Significant Diagnostic Studies: labs: Hgb 11  Treatments: IV hydration, antibiotics: Ancef, analgesia: PCA, Percocet and surgery: Total vaginal hysterectomy, bilateral salpingo-oophorectomy, and posterior repair  Discharge Exam: Blood pressure 126/74, pulse 74, temperature 97.9 F (36.6 C), temperature source Oral, resp. rate 16, height 5' 2.5" (1.588 m), weight 71.668 kg (158 lb), last menstrual period 07/12/2012, SpO2 100.00%. Gen: A&Ox3, NAD  CV: RRR, no MRG  Resp: CTAB  Abdomen: soft, appropriately tender, non-distended, BS quiet  GU: Minimal vaginal bleeding noted on pad  Ext: No edema, no calf tenderness bilaterally, SCDs in place  Disposition: 01-Home or Self Care     Medication List    STOP taking these medications       clindamycin 1 % gel  Commonly known as:  CLINDAGEL      TAKE these medications       Biotin 2500 MCG Caps  Take 2,500 mcg by mouth 1 day or 1 dose.     calcium-vitamin D 500-200 MG-UNIT per tablet  Commonly known as:  OSCAL WITH D  Take 1 tablet by mouth daily with breakfast.     cholecalciferol 1000 UNITS tablet  Commonly known as:  VITAMIN D  Take 4,000 Units by mouth daily.      citalopram 40 MG tablet  Commonly known as:  CELEXA  Take 40 mg by mouth daily.     docusate sodium 100 MG capsule  Commonly known as:  COLACE  Take 250 mg by mouth 1 day or 1 dose.     doxycycline 100 MG capsule  Commonly known as:  VIBRAMYCIN  Take 100 mg by mouth 2 (two) times daily.     ibuprofen 200 MG tablet  Commonly known as:  MOTRIN IB  Take 3 tablets (600 mg total) by mouth every 6 (six) hours as needed.     JUICE PLUS FIBRE PO  Take 4 capsules by mouth daily.     multivitamin with minerals Tabs tablet  Take 1 tablet by mouth daily.     naphazoline-pheniramine 0.025-0.3 % ophthalmic solution  Commonly known as:  NAPHCON-A  Place 1 drop into both eyes daily as needed for irritation or allergies.     oxyCODONE-acetaminophen 5-325 MG per tablet  Commonly known as:  PERCOCET/ROXICET  Take 1 tablet by mouth every 4 (four) hours as needed for severe pain (moderate to severe pain (when tolerating fluids)).     polyethylene glycol packet  Commonly known as:  MIRALAX / GLYCOLAX  Take 17 g by mouth daily.     vitamin C 500 MG tablet  Commonly known as:  ASCORBIC ACID  Take 1,000 mg by mouth daily.           Follow-up Information   Follow up with Catha Brow., MD In 2 weeks.   Specialty:  Obstetrics and Gynecology   Contact information:   [address] [place] [address] [phone]       Signed: Annalee Genta 04/07/2013, 5:39 AM

## 2014-03-02 ENCOUNTER — Other Ambulatory Visit: Payer: Self-pay

## 2014-03-02 DIAGNOSIS — Z1231 Encounter for screening mammogram for malignant neoplasm of breast: Secondary | ICD-10-CM

## 2014-03-07 ENCOUNTER — Ambulatory Visit
Admission: RE | Admit: 2014-03-07 | Discharge: 2014-03-07 | Disposition: A | Discharge status: Home / Self Care | Payer: 59 | Source: Ambulatory Visit

## 2014-03-07 DIAGNOSIS — Z1231 Encounter for screening mammogram for malignant neoplasm of breast: Secondary | ICD-10-CM

## 2014-10-11 ENCOUNTER — Other Ambulatory Visit: Payer: Self-pay | Admitting: Gastroenterology

## 2014-12-20 ENCOUNTER — Encounter (HOSPITAL_COMMUNITY): Payer: Self-pay | Admitting: *Deleted

## 2014-12-26 ENCOUNTER — Ambulatory Visit (HOSPITAL_COMMUNITY)
Admission: RE | Admit: 2014-12-26 | Discharge: 2014-12-26 | Disposition: A | Discharge status: Home / Self Care | Payer: 59 | Source: Ambulatory Visit | Attending: Gastroenterology | Admitting: Gastroenterology

## 2014-12-26 ENCOUNTER — Ambulatory Visit (HOSPITAL_COMMUNITY): Payer: 59 | Admitting: Anesthesiology

## 2014-12-26 ENCOUNTER — Encounter (HOSPITAL_COMMUNITY): Payer: Self-pay | Admitting: *Deleted

## 2014-12-26 ENCOUNTER — Encounter (HOSPITAL_COMMUNITY)
Admission: RE | Disposition: A | Discharge status: Home / Self Care | Payer: Self-pay | Source: Ambulatory Visit | Attending: Gastroenterology

## 2014-12-26 DIAGNOSIS — Z1211 Encounter for screening for malignant neoplasm of colon: Secondary | ICD-10-CM | POA: Diagnosis present

## 2014-12-26 DIAGNOSIS — E7800 Pure hypercholesterolemia, unspecified: Secondary | ICD-10-CM | POA: Diagnosis not present

## 2014-12-26 HISTORY — PX: COLONOSCOPY WITH PROPOFOL: SHX5780

## 2014-12-26 SURGERY — COLONOSCOPY WITH PROPOFOL
Anesthesia: Monitor Anesthesia Care

## 2014-12-26 MED ORDER — LACTATED RINGERS IV SOLN
INTRAVENOUS | Status: DC
  Administered 2014-12-26: 11:00:00 via INTRAVENOUS
  Administered 2014-12-26: 1000 mL via INTRAVENOUS

## 2014-12-26 MED ORDER — SODIUM CHLORIDE 0.9 % IV SOLN: INTRAVENOUS | Status: DC

## 2014-12-26 MED ORDER — FENTANYL CITRATE (PF) 100 MCG/2ML IJ SOLN
INTRAMUSCULAR | Status: DC | PRN
  Administered 2014-12-26 (×2): 50 ug via INTRAVENOUS

## 2014-12-26 MED ORDER — PROPOFOL 500 MG/50ML IV EMUL
INTRAVENOUS | Status: DC | PRN
  Administered 2014-12-26: 140 ug/kg/min via INTRAVENOUS

## 2014-12-26 MED ORDER — FENTANYL CITRATE (PF) 100 MCG/2ML IJ SOLN
INTRAMUSCULAR | Status: AC
  Filled 2014-12-26: qty 4

## 2014-12-26 MED ORDER — PROPOFOL 10 MG/ML IV BOLUS
INTRAVENOUS | Status: AC
  Filled 2014-12-26: qty 20

## 2014-12-26 MED ORDER — PROPOFOL 10 MG/ML IV BOLUS
INTRAVENOUS | Status: DC | PRN
  Administered 2014-12-26 (×2): 20 mg via INTRAVENOUS

## 2014-12-26 SURGICAL SUPPLY — 22 items

## 2014-12-26 NOTE — Discharge Instructions (Signed)
Colonoscopy A colonoscopy is an exam to look at your colon. This exam can help find lumps (tumors), growths (polyps), bleeding, and redness and puffiness (inflammation) in your colon.  BEFORE THE PROCEDURE  Ask your doctor about changing or stopping your regular medicines.  You may need to drink a large amount of a special liquid (oral bowel prep). You start drinking this the day before your procedure. It will cause you to have watery poop (stool). This cleans out your colon.  Do not eat or drink anything else once you have started the bowel prep, unless your doctor tells you it is safe to do so.  Make plans for someone to drive you home after the procedure. PROCEDURE  You will be given medicine to help you relax (sedative).  You will lie on your side with your knees bent.  A tube with a camera on the end is put in the opening of your butt (anus) and into your colon. Pictures are sent to a computer screen. Your doctor will look for anything that is not normal.  Your doctor may take a tissue sample (biopsy) from your colon to be looked at more closely.  The exam is finished when your doctor has viewed all of the colon. AFTER THE PROCEDURE  Do not drive for 24 hours after the exam.  You may have a small amount of blood in your poop. This is normal.  You may pass gas and have belly (abdominal) cramps. This is normal.  Ask when your test results will be ready. Make sure you get your test results.   This information is not intended to replace advice given to you by your health care provider. Make sure you discuss any questions you have with your health care provider.   Document Released: 03/08/2010 Document Revised: 02/08/2013 Document Reviewed: 10/11/2012 Elsevier Interactive Patient Education [employer].

## 2014-12-26 NOTE — Anesthesia Postprocedure Evaluation (Signed)
Anesthesia Post Note  Patient: Kristen Buck  Procedure(s) Performed: Procedure(s) (LRB): COLONOSCOPY WITH PROPOFOL (N/A)  Anesthesia type: MAC  Patient location: PACU  Post pain: Pain level controlled  Post assessment: Patient's Cardiovascular Status Stable  Last Vitals:  Filed Vitals:   12/26/14 1109  BP: 158/83  Pulse: 63  Temp:   Resp: 11    Post vital signs: Reviewed and stable  Level of consciousness: sedated  Complications: No apparent anesthesia complications

## 2014-12-26 NOTE — Anesthesia Preprocedure Evaluation (Signed)
Anesthesia Evaluation  Patient identified by MRN, date of birth, ID band Patient awake    Reviewed: Allergy & Precautions, NPO status , Patient's Chart, lab work & pertinent test results  Airway Mallampati: I  TM Distance: >3 FB Neck ROM: Full    Dental   Pulmonary    Pulmonary exam normal        Cardiovascular Normal cardiovascular exam     Neuro/Psych Anxiety    GI/Hepatic   Endo/Other    Renal/GU      Musculoskeletal   Abdominal   Peds  Hematology   Anesthesia Other Findings   Reproductive/Obstetrics                             Anesthesia Physical Anesthesia Plan  ASA: II  Anesthesia Plan: MAC   Post-op Pain Management:    Induction: Intravenous  Airway Management Planned: Simple Face Mask  Additional Equipment:   Intra-op Plan:   Post-operative Plan:   Informed Consent: I have reviewed the patients History and Physical, chart, labs and discussed the procedure including the risks, benefits and alternatives for the proposed anesthesia with the patient or authorized representative who has indicated his/her understanding and acceptance.     Plan Discussed with: CRNA and Surgeon  Anesthesia Plan Comments:         Anesthesia Quick Evaluation

## 2014-12-26 NOTE — H&P (Signed)
  Procedure: Baseline screening colonoscopy. No family history of colon cancer.  History: The patient is a 49 year old female born March 20, 1965. She is scheduled to undergo a screening colonoscopy today.  Past medical history: Depression. Hypercholesterolemia. Hysterectomy performed to treat uterine prolapse.  Medication allergies: None  Exam: The patient is alert and lying comfortably on the endoscopy stretcher. Abdomen is soft and nontender to palpation. Lungs are clear to auscultation. Cardiac exam reveals a regular rhythm.  Plan: Proceed baseline screening colonoscopy

## 2014-12-26 NOTE — Transfer of Care (Signed)
Immediate Anesthesia Transfer of Care Note  Patient: Kristen Buck  Procedure(s) Performed: Procedure(s): COLONOSCOPY WITH PROPOFOL (N/A)  Patient Location: Endoscopy Unit  Anesthesia Type:MAC  Level of Consciousness: awake, alert  and oriented  Airway & Oxygen Therapy: Patient Spontanous Breathing  Post-op Assessment: Report given to RN and Post -op Vital signs reviewed and stable  Post vital signs: Reviewed and stable  Last Vitals:  Filed Vitals:   12/26/14 0850  BP: 137/74  Temp: 36.9 C  Resp: 14    Complications: No apparent anesthesia complications

## 2014-12-26 NOTE — Op Note (Signed)
Procedure: Baseline screening colonoscopy  Endoscopist: Earle Gell  Premedication: Propofol administered by anesthesia  Procedure: The patient was placed in the left lateral decubitus position. Anal inspection and digital rectal exam were normal. The Pentax pediatric colonoscope was introduced into the rectum and advanced to the cecum. A normal-appearing appendiceal orifice was identified. A normal-appearing ileocecal valve was intubated and the terminal ileum inspected. Colonic preparation for the exam today was good. Withdrawal time was 9 minutes  Rectum. Normal. Retroflexed view of the distal rectum was normal  Sigmoid colon and descending colon. Normal  Splenic flexure. Normal  Transverse colon. Normal  Hepatic flexure. Normal  Ascending colon. Normal  Cecum and ileocecal valve. Normal  Terminal ileum. Normal  Assessment: Normal screening colonoscopy  Recommendation: Schedule repeat screening colonoscopy in 10 years

## 2014-12-27 ENCOUNTER — Encounter (HOSPITAL_COMMUNITY): Payer: Self-pay | Admitting: Gastroenterology

## 2015-02-07 ENCOUNTER — Other Ambulatory Visit: Payer: Self-pay

## 2015-02-07 DIAGNOSIS — Z1231 Encounter for screening mammogram for malignant neoplasm of breast: Secondary | ICD-10-CM

## 2015-03-12 ENCOUNTER — Ambulatory Visit
Admission: RE | Admit: 2015-03-12 | Discharge: 2015-03-12 | Disposition: A | Discharge status: Home / Self Care | Payer: 59 | Source: Ambulatory Visit

## 2015-03-12 DIAGNOSIS — Z1231 Encounter for screening mammogram for malignant neoplasm of breast: Secondary | ICD-10-CM

## 2016-02-05 ENCOUNTER — Other Ambulatory Visit: Payer: Self-pay | Admitting: Obstetrics and Gynecology

## 2016-02-05 DIAGNOSIS — Z1231 Encounter for screening mammogram for malignant neoplasm of breast: Secondary | ICD-10-CM

## 2016-03-12 ENCOUNTER — Ambulatory Visit
Admission: RE | Admit: 2016-03-12 | Discharge: 2016-03-12 | Disposition: A | Discharge status: Home / Self Care | Payer: 59 | Source: Ambulatory Visit | Attending: Obstetrics and Gynecology | Admitting: Obstetrics and Gynecology

## 2016-03-12 DIAGNOSIS — Z1231 Encounter for screening mammogram for malignant neoplasm of breast: Secondary | ICD-10-CM | POA: Diagnosis not present

## 2016-04-04 DIAGNOSIS — N62 Hypertrophy of breast: Secondary | ICD-10-CM | POA: Diagnosis not present

## 2016-05-27 DIAGNOSIS — Z01411 Encounter for gynecological examination (general) (routine) with abnormal findings: Secondary | ICD-10-CM | POA: Diagnosis not present

## 2016-06-27 ENCOUNTER — Ambulatory Visit (INDEPENDENT_AMBULATORY_CARE_PROVIDER_SITE_OTHER): Payer: 59 | Admitting: Physician Assistant

## 2016-06-27 ENCOUNTER — Encounter: Payer: Self-pay | Admitting: Physician Assistant

## 2016-06-27 VITALS — BP 124/81 | HR 71 | Temp 98.6°F | Resp 17 | Ht 63.0 in | Wt 158.0 lb

## 2016-06-27 DIAGNOSIS — L255 Unspecified contact dermatitis due to plants, except food: Secondary | ICD-10-CM

## 2016-06-27 DIAGNOSIS — L237 Allergic contact dermatitis due to plants, except food: Secondary | ICD-10-CM

## 2016-06-27 MED ORDER — PREDNISONE 20 MG PO TABS: ORAL_TABLET | ORAL | 0 refills | Status: AC

## 2016-06-27 NOTE — Progress Notes (Signed)
  06/27/2016 9:45 AM   DOB: June 15, 1965 / MRN: [id number]  SUBJECTIVE:  Kristen Buck is a 51 y.o. female presenting for itching and rash that started five days ago after working in the yard.  Says that she was working in the yard just before the rash started. Feels that she is slowly getting worse. She has been using cream for itching with some relief. She is being screening for diabetes by her family physician and tells me she is not diabetic.   She has No Known Allergies.   She  has a past medical history of Anxiety; Medical history non-contributory; and Vaginal delivery [phone]).    She  reports that she has never smoked. She has never used smokeless tobacco. She reports that she does not drink alcohol or use drugs. She  reports that she currently engages in sexual activity. The patient  has a past surgical history that includes No past surgeries; Vaginal hysterectomy (N/A, 03/30/2013); Salpingoophorectomy (Bilateral, 03/30/2013); Rectocele repair (N/A, 03/30/2013); Abdominal hysterectomy; and Colonoscopy with propofol (N/A, 12/26/2014).  Her family history includes Hypercholesterolemia in her father and mother; Hyperlipidemia in her father and mother; Hypertension in her father, maternal grandfather, mother, and paternal grandfather.  Review of Systems  Constitutional: Negative for chills, diaphoresis and fever.  Cardiovascular: Negative for chest pain.  Gastrointestinal: Negative for nausea.  Skin: Negative for rash.  Neurological: Negative for dizziness.    The problem list and medications were reviewed and updated by myself where necessary and exist elsewhere in the encounter.   OBJECTIVE:  BP 124/81 (BP Location: Right Arm, Patient Position: Sitting, Cuff Size: Normal)   Pulse 71   Temp 98.6 F (37 C) (Oral)   Resp 17   Ht 5\' 3"  (1.6 m)   Wt 158 lb (71.7 kg)   LMP 07/12/2012   SpO2 99%   BMI 27.99 kg/m   Physical Exam  Constitutional: She is active.  Non-toxic  appearance.  Cardiovascular: Normal rate, S1 normal and S2 normal.  Exam reveals no gallop, no friction rub and no decreased pulses.   No murmur heard. Pulmonary/Chest: Effort normal. No tachypnea.  Abdominal: She exhibits no distension.  Musculoskeletal: She exhibits no edema.  Neurological: She is alert.  Skin: Skin is warm and dry. Rash noted. She is not diaphoretic. No pallor.         No results found for this or any previous visit (from the past 72 hour(s)).  No results found.  ASSESSMENT AND PLAN:  Loraina was seen today for poison ivy.  Diagnoses and all orders for this visit:  Rhus dermatitis: options discussed.  She was po steroids.   12 day course. Will see her back if needed.  -     predniSONE (DELTASONE) 20 MG tablet; Take 3 in the morning for 4 days, then 2 in the morning for 4 days, and then 1 in the morning for 4 days.    The patient is advised to call or return to clinic if she does not see an improvement in symptoms, or to seek the care of the closest emergency department if she worsens with the above plan.   Philis Fendt, MHS, PA-C [facility] 06/27/2016 9:45 AM

## 2016-06-27 NOTE — Patient Instructions (Addendum)
Take cool baths and continue symptomatic therapies at home.  Benadryl and zyrtec can be particularly helpful for itching. Please let me know if I can help you with anything in the future.      IF you received an x-ray today, you will receive an invoice from [facility]. Please contact [facility] at [phone] with questions or concerns regarding your invoice.   IF you received labwork today, you will receive an invoice from [place]. Please contact [facility] at [phone] with questions or concerns regarding your invoice.   Our billing staff will not be able to assist you with questions regarding bills from these companies.  You will be contacted with the lab results as soon as they are available. The fastest way to get your results is to activate your My Chart account. Instructions are located on the last page of this paperwork. If you have not heard from [place] regarding the results in 2 weeks, please contact this office.

## 2016-07-04 ENCOUNTER — Ambulatory Visit (INDEPENDENT_AMBULATORY_CARE_PROVIDER_SITE_OTHER): Payer: 59 | Admitting: Physician Assistant

## 2016-07-04 ENCOUNTER — Encounter: Payer: Self-pay | Admitting: Physician Assistant

## 2016-07-04 VITALS — BP 126/82 | HR 113 | Temp 98.9°F | Resp 17 | Ht 63.0 in | Wt 158.0 lb

## 2016-07-04 DIAGNOSIS — L237 Allergic contact dermatitis due to plants, except food: Secondary | ICD-10-CM

## 2016-07-04 DIAGNOSIS — L255 Unspecified contact dermatitis due to plants, except food: Secondary | ICD-10-CM

## 2016-07-04 MED ORDER — HYDROXYZINE HCL 25 MG PO TABS: 12.5000 mg | ORAL_TABLET | Freq: Three times a day (TID) | ORAL | 0 refills | Status: DC | PRN

## 2016-07-04 MED ORDER — RANITIDINE HCL 150 MG PO TABS: 150.0000 mg | ORAL_TABLET | Freq: Two times a day (BID) | ORAL | 0 refills | Status: DC

## 2016-07-04 MED ORDER — CETIRIZINE HCL 10 MG PO TABS: 10.0000 mg | ORAL_TABLET | Freq: Every day | ORAL | 0 refills | Status: DC

## 2016-07-04 MED ORDER — CEPHALEXIN 500 MG PO CAPS: 500.0000 mg | ORAL_CAPSULE | Freq: Two times a day (BID) | ORAL | 0 refills | Status: DC

## 2016-07-04 MED ORDER — CLOBETASOL PROPIONATE 0.05 % EX CREA
1.0000 | TOPICAL_CREAM | Freq: Two times a day (BID) | CUTANEOUS | 0 refills | Status: DC
Start: 2016-07-04 — End: 2022-03-31

## 2016-07-04 NOTE — Patient Instructions (Addendum)
Do not use the clobetasol cream longer than 1 week to avoid skin thinning.  Poison Ivy Dermatitis Poison ivy dermatitis is redness and soreness (inflammation) of the skin. It is caused by a chemical that is found on the leaves of the poison ivy plant. You may also have itching, a rash, and blisters. Symptoms often clear up in 1-2 weeks. You may get this condition by touching a poison ivy plant. You can also get it by touching something that has the chemical on it. This may include animals or objects that have come in contact with the plant. Follow these instructions at home: General instructions   Take or apply over-the-counter and prescription medicines only as told by your doctor.  If you touch poison ivy, wash your skin with soap and cold water right away.  Use hydrocortisone creams or calamine lotion as needed to help with itching.  Take oatmeal baths as needed. Use colloidal oatmeal. You can get this at a pharmacy or grocery store. Follow the instructions on the package.  Do not scratch or rub your skin.  While you have the rash, wash your clothes right after you wear them. Prevention   Know what poison ivy looks like so you can avoid it. This plant has three leaves with flowering branches on a single stem. The leaves are glossy. They have uneven edges that come to a point at the front.  If you have touched poison ivy, wash with soap and water right away. Be sure to wash under your fingernails.  When hiking or camping, wear long pants, a long-sleeved shirt, tall socks, and hiking boots. You can also use a lotion on your skin that helps to prevent contact with the chemical on the plant.  If you think that your clothes or outdoor gear came in contact with poison ivy, rinse them off with a garden hose before you bring them inside your house. Contact a doctor if:  You have open sores in the rash area.  You have more redness, swelling, or pain in the affected area.  You have redness  that spreads beyond the rash area.  You have fluid, blood, or pus coming from the affected area.  You have a fever.  You have a rash over a large area of your body.  You have a rash on your eyes, mouth, or genitals.  Your rash does not get better after a few days. Get help right away if:  Your face swells or your eyes swell shut.  You have trouble breathing.  You have trouble swallowing. This information is not intended to replace advice given to you by your health care provider. Make sure you discuss any questions you have with your health care provider. Document Released: 03/08/2010 Document Revised: 07/12/2015 Document Reviewed: 07/12/2014 Elsevier Interactive Patient Education  2017 [employer].    IF you received an x-ray today, you will receive an invoice from [facility]. Please contact [facility] at [phone] with questions or concerns regarding your invoice.   IF you received labwork today, you will receive an invoice from [place] [facility]. Please contact [facility] at [phone] with questions or concerns regarding your invoice.   Our billing staff will not be able to assist you with questions regarding bills from these companies.  You will be contacted with the lab results as soon as they are available. The fastest way to get your results is to activate your My Chart account. Instructions are located on the last page of this paperwork. If  you have not heard from [place] regarding the results in 2 weeks, please contact this office.

## 2016-07-04 NOTE — Progress Notes (Signed)
PRIMARY CARE AT [facility] [address] [place] [address] [phone]  Date:  07/04/2016   Name:  Verlisa Vara   DOB:  1966-01-28   MRN:  [id number]  PCP:  Seward Carol, MD    History of Present Illness:  Tristan Bramble is a 51 y.o. female patient who presents to PCP with  Chief Complaint  Patient presents with  . Follow-up    poison ivy not healing       She was seen here for this skin rash after developing this 8 days ago.  dxd as rhus after outdoor play.  Given prednisone taper.  Patient reports that she has not had signifcant relief of her symptoms.  She notes that her skin feels tight and in inflamed and warm to the touch.  Rash has spread to the right anterior chest and breast. Nothing occurring with nose eyes, or vaginal area.   No fever or feeling sickly.   No drainage to the lesions.     Patient Active Problem List   Diagnosis Date Noted  . S/P vaginal hysterectomy 03/30/2013    Past Medical History:  Diagnosis Date  . Anxiety    takes meds  . Medical history non-contributory   . Vaginal delivery [phone]    Past Surgical History:  Procedure Laterality Date  . ABDOMINAL HYSTERECTOMY    . COLONOSCOPY WITH PROPOFOL N/A 12/26/2014   Procedure: COLONOSCOPY WITH PROPOFOL;  Surgeon: Garlan Fair, MD;  Location: [facility];  Service: Endoscopy;  Laterality: N/A;  . NO PAST SURGERIES    . RECTOCELE REPAIR N/A 03/30/2013   Procedure: POSTERIOR REPAIR (RECTOCELE);  Surgeon: Maeola Sarah. Landry Mellow, MD;  Location: [facility];  Service: Gynecology;  Laterality: N/A;  . SALPINGOOPHORECTOMY Bilateral 03/30/2013   Procedure: BILATERAL SALPINGO OOPHORECTOMY;  Surgeon: Maeola Sarah. Landry Mellow, MD;  Location: [facility];  Service: Gynecology;  Laterality: Bilateral;  . VAGINAL HYSTERECTOMY N/A 03/30/2013   Procedure: HYSTERECTOMY VAGINAL;  Surgeon: Maeola Sarah. Landry Mellow, MD;  Location: [facility];  Service: Gynecology;  Laterality: N/A;    Social History  Substance Use Topics  . Smoking status: Never  Smoker  . Smokeless tobacco: Never Used  . Alcohol use No    Family History  Problem Relation Age of Onset  . Hypertension Mother   . Hypercholesterolemia Mother   . Hyperlipidemia Mother   . Hypertension Father   . Hypercholesterolemia Father   . Hyperlipidemia Father   . Hypertension Maternal Grandfather   . Hypertension Paternal Grandfather     No Known Allergies  Medication list has been reviewed and updated.  Current Outpatient Prescriptions on File Prior to Visit  Medication Sig Dispense Refill  . calcium-vitamin D (OSCAL WITH D) 500-200 MG-UNIT per tablet Take 1 tablet by mouth daily with breakfast.    . cholecalciferol (VITAMIN D) 1000 UNITS tablet Take 4,000 Units by mouth daily.    . citalopram (CELEXA) 40 MG tablet Take 40 mg by mouth every morning.     Marland Kitchen ESTRACE VAGINAL 0.1 MG/GM vaginal cream     . ibuprofen (MOTRIN IB) 200 MG tablet Take 3 tablets (600 mg total) by mouth every 6 (six) hours as needed. 30 tablet 0  . magnesium 30 MG tablet Take 30 mg by mouth 2 (two) times daily.    . Multiple Vitamin (MULTIVITAMIN WITH MINERALS) TABS tablet Take 1 tablet by mouth daily.    . Nutritional Supplements (DHEA PO) Take 1 tablet by mouth daily.    . predniSONE (DELTASONE)  20 MG tablet Take 3 in the morning for 4 days, then 2 in the morning for 4 days, and then 1 in the morning for 4 days. 24 tablet 0  . vitamin C (ASCORBIC ACID) 500 MG tablet Take 1,000 mg by mouth daily.     Marland Kitchen zinc gluconate 50 MG tablet Take 50 mg by mouth daily.     No current facility-administered medications on file prior to visit.     ROS ROS otherwise unremarkable unless listed above.  Physical Examination: BP 126/82   Pulse (!) 113   Temp 98.9 F (37.2 C) (Oral)   Resp 17   Ht 5\' 3"  (1.6 m)   Wt 158 lb (71.7 kg)   LMP 07/12/2012   SpO2 98%   BMI 27.99 kg/m  Ideal Body Weight: Weight in (lb) to have BMI = 25: 140.8  Physical Exam  Constitutional: She is oriented to person, place,  and time. She appears well-developed and well-nourished. No distress.  HENT:  Head: Normocephalic and atraumatic.  Right Ear: External ear normal.  Left Ear: External ear normal.  Eyes: Conjunctivae and EOM are normal. Pupils are equal, round, and reactive to light.  Cardiovascular: Normal rate.   Pulmonary/Chest: Effort normal. No respiratory distress.  Neurological: She is alert and oriented to person, place, and time.  Skin: She is not diaphoretic.  Erythematous patch with papular rash and some larger nodular like swelling.  No fluctuance.  The erythema is along the anterior chest, and upper extremity flexor areas.   Right upper arm is mildly edematous and warm to the touch.  Psychiatric: She has a normal mood and affect. Her behavior is normal.     Assessment and Plan: Arlanda Shiplett is a 51 y.o. female who is here today for rash. This still appears to be likely some contact dermatitis. Adding a topical clobetasol.  Advised not to use longer than one week. Advised to restart the prednisone.  Given abx, to treat for possible cellulitis.  Zantac and zyrtec.  rtc in 5 days. Rhus dermatitis - Plan: cetirizine (ZYRTEC) 10 MG tablet, ranitidine (ZANTAC) 150 MG tablet, cephALEXin (KEFLEX) 500 MG capsule, clobetasol cream (TEMOVATE) 0.05 %, hydrOXYzine (ATARAX/VISTARIL) 25 MG tablet  Ivar Drape, PA-C [facility] 5/20/20188:35 AM

## 2016-07-09 ENCOUNTER — Ambulatory Visit: Payer: 59 | Admitting: Physician Assistant

## 2016-07-09 ENCOUNTER — Telehealth: Payer: Self-pay

## 2016-07-09 NOTE — Telephone Encounter (Signed)
PA filed today under cover my meds. Will take up to 3-5 business days for response.  Medication is certirizine.

## 2016-07-16 NOTE — Telephone Encounter (Signed)
Pa for cetirizine came back denied as a plan exclusion, needs to buy otc

## 2016-07-18 NOTE — Telephone Encounter (Signed)
Pt advised but stated they actually filled the rx

## 2016-07-28 DIAGNOSIS — L239 Allergic contact dermatitis, unspecified cause: Secondary | ICD-10-CM | POA: Diagnosis not present

## 2016-12-16 DIAGNOSIS — Z23 Encounter for immunization: Secondary | ICD-10-CM | POA: Diagnosis not present

## 2017-02-20 DIAGNOSIS — J018 Other acute sinusitis: Secondary | ICD-10-CM | POA: Diagnosis not present

## 2017-02-21 ENCOUNTER — Other Ambulatory Visit: Payer: Self-pay

## 2017-02-21 ENCOUNTER — Encounter (HOSPITAL_BASED_OUTPATIENT_CLINIC_OR_DEPARTMENT_OTHER): Payer: Self-pay | Admitting: *Deleted

## 2017-02-21 ENCOUNTER — Emergency Department (HOSPITAL_BASED_OUTPATIENT_CLINIC_OR_DEPARTMENT_OTHER): Payer: 59

## 2017-02-21 DIAGNOSIS — Y929 Unspecified place or not applicable: Secondary | ICD-10-CM | POA: Insufficient documentation

## 2017-02-21 DIAGNOSIS — W228XXA Striking against or struck by other objects, initial encounter: Secondary | ICD-10-CM | POA: Diagnosis not present

## 2017-02-21 DIAGNOSIS — S62353A Nondisplaced fracture of shaft of third metacarpal bone, left hand, initial encounter for closed fracture: Secondary | ICD-10-CM | POA: Diagnosis not present

## 2017-02-21 DIAGNOSIS — Z79899 Other long term (current) drug therapy: Secondary | ICD-10-CM | POA: Diagnosis not present

## 2017-02-21 DIAGNOSIS — S6292XA Unspecified fracture of left wrist and hand, initial encounter for closed fracture: Secondary | ICD-10-CM | POA: Insufficient documentation

## 2017-02-21 DIAGNOSIS — Y999 Unspecified external cause status: Secondary | ICD-10-CM | POA: Diagnosis not present

## 2017-02-21 DIAGNOSIS — Y9389 Activity, other specified: Secondary | ICD-10-CM | POA: Diagnosis not present

## 2017-02-21 DIAGNOSIS — S62351A Nondisplaced fracture of shaft of second metacarpal bone, left hand, initial encounter for closed fracture: Secondary | ICD-10-CM | POA: Diagnosis not present

## 2017-02-21 DIAGNOSIS — S62360A Nondisplaced fracture of neck of second metacarpal bone, right hand, initial encounter for closed fracture: Secondary | ICD-10-CM | POA: Diagnosis not present

## 2017-02-21 DIAGNOSIS — Z23 Encounter for immunization: Secondary | ICD-10-CM | POA: Diagnosis not present

## 2017-02-21 DIAGNOSIS — S6992XA Unspecified injury of left wrist, hand and finger(s), initial encounter: Secondary | ICD-10-CM | POA: Diagnosis present

## 2017-02-21 NOTE — ED Triage Notes (Signed)
Pt reports she was trying to close blinds and a stereo speaker landed on her left hand. States "then I tumbled down the stairs" Reports tailbone pain. Denies LOC

## 2017-02-22 ENCOUNTER — Emergency Department (HOSPITAL_BASED_OUTPATIENT_CLINIC_OR_DEPARTMENT_OTHER)
Admission: EM | Admit: 2017-02-22 | Discharge: 2017-02-22 | Disposition: A | Discharge status: Home / Self Care | Payer: 59 | Attending: Physician Assistant | Admitting: Physician Assistant

## 2017-02-22 DIAGNOSIS — S6292XA Unspecified fracture of left wrist and hand, initial encounter for closed fracture: Secondary | ICD-10-CM

## 2017-02-22 DIAGNOSIS — S62353A Nondisplaced fracture of shaft of third metacarpal bone, left hand, initial encounter for closed fracture: Secondary | ICD-10-CM | POA: Diagnosis not present

## 2017-02-22 DIAGNOSIS — S62351A Nondisplaced fracture of shaft of second metacarpal bone, left hand, initial encounter for closed fracture: Secondary | ICD-10-CM | POA: Diagnosis not present

## 2017-02-22 MED ORDER — OXYCODONE-ACETAMINOPHEN 5-325 MG PO TABS: 1.0000 | ORAL_TABLET | Freq: Four times a day (QID) | ORAL | 0 refills | Status: DC | PRN

## 2017-02-22 MED ORDER — TETANUS-DIPHTH-ACELL PERTUSSIS 5-2.5-18.5 LF-MCG/0.5 IM SUSP
0.5000 mL | Freq: Once | INTRAMUSCULAR | Status: AC
  Administered 2017-02-22: 0.5 mL via INTRAMUSCULAR
  Filled 2017-02-22: qty 0.5

## 2017-02-22 MED ORDER — OXYCODONE-ACETAMINOPHEN 5-325 MG PO TABS
1.0000 | ORAL_TABLET | Freq: Once | ORAL | Status: AC
  Administered 2017-02-22: 1 via ORAL
  Filled 2017-02-22: qty 1

## 2017-02-22 NOTE — ED Notes (Signed)
Pt states she was trying to close blinds and fell. C/O pain to left hand. Abrasion noted. Swelling and bruising at site. Ice applied. Moves fingers. Feels touch. Cap refill < 3 sec.

## 2017-02-22 NOTE — ED Notes (Signed)
ED Provider at bedside. 

## 2017-02-22 NOTE — Discharge Instructions (Signed)
You were seen and found to have metacarpal fractures today.  Please follow-up with your hand surgeon.  You may use this pain medication to help with pain.  Otherwise he can use ibuprofen and Tylenol..  Please return with any concerns.

## 2017-02-22 NOTE — ED Notes (Signed)
Pt given d/c instructions as per chart. rx x 1 with precautions. Verbalizes understanding. No questions. 

## 2017-02-22 NOTE — ED Notes (Signed)
PMS intact before and after. Pt tolerated well. All questions answered. 

## 2017-02-22 NOTE — ED Provider Notes (Signed)
[facility] Provider Note   CSN: [id number] Arrival date & time: 02/21/17  2140     History   Chief Complaint Chief Complaint  Patient presents with  . Hand Injury    HPI Skye Plamondon is a 52 y.o. female.  HPI  Patient is a 52 year old presenting with hand injury.  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  Patient drove here to be evaluated.  This happened at 8 PM.  Took 3 Advil prior to arrival.  Sensation and strength in tact distally.    Past Medical History:  Diagnosis Date  . Anxiety    takes meds  . Medical history non-contributory   . Vaginal delivery [phone]    Patient Active Problem List   Diagnosis Date Noted  . S/P vaginal hysterectomy 03/30/2013    Past Surgical History:  Procedure Laterality Date  . ABDOMINAL HYSTERECTOMY    . COLONOSCOPY WITH PROPOFOL N/A 12/26/2014   Procedure: COLONOSCOPY WITH PROPOFOL;  Surgeon: Garlan Fair, MD;  Location: [facility];  Service: Endoscopy;  Laterality: N/A;  . NO PAST SURGERIES    . RECTOCELE REPAIR N/A 03/30/2013   Procedure: POSTERIOR REPAIR (RECTOCELE);  Surgeon: Maeola Sarah. Landry Mellow, MD;  Location: [facility];  Service: Gynecology;  Laterality: N/A;  . SALPINGOOPHORECTOMY Bilateral 03/30/2013   Procedure: BILATERAL SALPINGO OOPHORECTOMY;  Surgeon: Maeola Sarah. Landry Mellow, MD;  Location: [facility];  Service: Gynecology;  Laterality: Bilateral;  . VAGINAL HYSTERECTOMY N/A 03/30/2013   Procedure: HYSTERECTOMY VAGINAL;  Surgeon: Maeola Sarah. Landry Mellow, MD;  Location: [facility];  Service: Gynecology;  Laterality: N/A;    OB History    No data available       Home Medications    Prior to Admission medications   Medication Sig Start Date End Date Taking? Authorizing Provider  citalopram (CELEXA) 40 MG tablet Take 40 mg by mouth every morning.    Yes [provider]  calcium-vitamin D (OSCAL WITH D) 500-200 MG-UNIT per tablet Take 1 tablet by mouth  daily with breakfast.    [provider]  cephALEXin (KEFLEX) 500 MG capsule Take 1 capsule (500 mg total) by mouth 2 (two) times daily. 07/04/16   Ivar Drape D, PA  cetirizine (ZYRTEC) 10 MG tablet Take 1 tablet (10 mg total) by mouth daily. 07/04/16   Ivar Drape D, PA  cholecalciferol (VITAMIN D) 1000 UNITS tablet Take 4,000 Units by mouth daily.    [provider]  clobetasol cream (TEMOVATE) 3.23 % Apply 1 application topically 2 (two) times daily. 07/04/16   Joretta Bachelor, PA  ESTRACE VAGINAL 0.1 MG/GM vaginal cream  05/22/16   [provider]  hydrOXYzine (ATARAX/VISTARIL) 25 MG tablet Take 0.5-1 tablets (12.5-25 mg total) by mouth every 8 (eight) hours as needed for itching. 07/04/16   Ivar Drape D, PA  ibuprofen (MOTRIN IB) 200 MG tablet Take 3 tablets (600 mg total) by mouth every 6 (six) hours as needed. 03/31/13   Janyth Pupa, DO  magnesium 30 MG tablet Take 30 mg by mouth 2 (two) times daily.    [provider]  Multiple Vitamin (MULTIVITAMIN WITH MINERALS) TABS tablet Take 1 tablet by mouth daily.    [provider]  Nutritional Supplements (DHEA PO) Take 1 tablet by mouth daily.    [provider]  ranitidine (ZANTAC) 150 MG tablet Take 1 tablet (150 mg total) by mouth 2 (two) times daily. 07/04/16  Ivar Drape D, PA  vitamin C (ASCORBIC ACID) 500 MG tablet Take 1,000 mg by mouth daily.     [provider]  zinc gluconate 50 MG tablet Take 50 mg by mouth daily.    [provider]    Family History Family History  Problem Relation Age of Onset  . Hypertension Mother   . Hypercholesterolemia Mother   . Hyperlipidemia Mother   . Hypertension Father   . Hypercholesterolemia Father   . Hyperlipidemia Father   . Hypertension Maternal Grandfather   . Hypertension Paternal Grandfather     Social History Social History   Tobacco Use  . Smoking status: Never Smoker  .  Smokeless tobacco: Never Used  Substance Use Topics  . Alcohol use: No  . Drug use: No     Allergies   Patient has no known allergies.   Review of Systems Review of Systems  Constitutional: Negative for activity change.  Respiratory: Negative for shortness of breath.   Cardiovascular: Negative for chest pain.  Gastrointestinal: Negative for abdominal pain.  All other systems reviewed and are negative.    Physical Exam Updated Vital Signs BP 140/79 (BP Location: Right Arm)   Pulse 72   Temp 98.1 F (36.7 C) (Oral)   Resp 20   LMP 07/12/2012   SpO2 100%   Physical Exam  Constitutional: She is oriented to person, place, and time. She appears well-developed and well-nourished.  HENT:  Head: Normocephalic and atraumatic.  Eyes: Right eye exhibits no discharge.  Cardiovascular: Normal rate, regular rhythm and normal heart sounds.  No murmur heard. Pulmonary/Chest: Effort normal and breath sounds normal. She has no wheezes. She has no rales.  Abdominal: Soft. She exhibits no distension. There is no tenderness.  Musculoskeletal:  Swelling to left hand with abrasion overlying.. Strenght and sesnsation in tact distally.   Neurological: She is oriented to person, place, and time.  Skin: Skin is warm and dry. She is not diaphoretic.  Psychiatric: She has a normal mood and affect.  Nursing note and vitals reviewed.    ED Treatments / Results  Labs (all labs ordered are listed, but only abnormal results are displayed) Labs Reviewed - No data to display  EKG  EKG Interpretation None       Radiology Dg Hand Complete Left  Result Date: 02/21/2017 CLINICAL DATA:  Pain, swelling, and abrasions on the left hand after a fall. EXAM: LEFT HAND - COMPLETE 3+ VIEW COMPARISON:  None. FINDINGS: Transverse fracture of the midshaft left second and third metacarpal bones without significant angulation or displacement. No articular involvement. Joint spaces are preserved. No  destructive or expansile bone lesions. Mild dorsal soft tissue swelling. IMPRESSION: Mostly transverse nondisplaced acute fractures of the midshaft left second and third metacarpal bones. Electronically Signed   By: Lucienne Capers M.D.   On: 02/21/2017 22:56    Procedures Procedures (including critical care time)  Medications Ordered in ED Medications  oxyCODONE-acetaminophen (PERCOCET/ROXICET) 5-325 MG per tablet 1 tablet (not administered)  Tdap (BOOSTRIX) injection 0.5 mL (not administered)     Initial Impression / Assessment and Plan / ED Course  I have reviewed the triage vital signs and the nursing notes.  Pertinent labs & imaging results that were available during my care of the patient were reviewed by me and considered in my medical decision making (see chart for details).      Patient is a 52 year old presenting with hand injury.  Patient reports that she was fixing  many lines and she fell in a speaker fell onto her hand.  Patient drove here to be evaluated.  This happened at 8 PM.  Took 3 Advil prior to arrival.  Sensation and strength in tact distally.   12:40 AM Patient here with 2 transverse fractures left hand.  Will splint, follow-up with Ortho.   Final Clinical Impressions(s) / ED Diagnoses   Final diagnoses:  None    ED Discharge Orders    None       Katelynd Blauvelt, Fredia Sorrow, MD 02/22/17 0041

## 2017-02-24 ENCOUNTER — Other Ambulatory Visit: Payer: Self-pay | Admitting: Obstetrics and Gynecology

## 2017-02-24 DIAGNOSIS — S62353A Nondisplaced fracture of shaft of third metacarpal bone, left hand, initial encounter for closed fracture: Secondary | ICD-10-CM | POA: Diagnosis not present

## 2017-02-24 DIAGNOSIS — M79642 Pain in left hand: Secondary | ICD-10-CM | POA: Diagnosis not present

## 2017-02-24 DIAGNOSIS — S62351A Nondisplaced fracture of shaft of second metacarpal bone, left hand, initial encounter for closed fracture: Secondary | ICD-10-CM | POA: Diagnosis not present

## 2017-02-24 DIAGNOSIS — Z1231 Encounter for screening mammogram for malignant neoplasm of breast: Secondary | ICD-10-CM

## 2017-03-04 DIAGNOSIS — S62353A Nondisplaced fracture of shaft of third metacarpal bone, left hand, initial encounter for closed fracture: Secondary | ICD-10-CM | POA: Diagnosis not present

## 2017-03-04 DIAGNOSIS — S62351A Nondisplaced fracture of shaft of second metacarpal bone, left hand, initial encounter for closed fracture: Secondary | ICD-10-CM | POA: Diagnosis not present

## 2017-03-11 DIAGNOSIS — S62353A Nondisplaced fracture of shaft of third metacarpal bone, left hand, initial encounter for closed fracture: Secondary | ICD-10-CM | POA: Diagnosis not present

## 2017-03-11 DIAGNOSIS — S62351A Nondisplaced fracture of shaft of second metacarpal bone, left hand, initial encounter for closed fracture: Secondary | ICD-10-CM | POA: Diagnosis not present

## 2017-03-18 ENCOUNTER — Ambulatory Visit: Payer: 59

## 2017-03-25 DIAGNOSIS — S62353A Nondisplaced fracture of shaft of third metacarpal bone, left hand, initial encounter for closed fracture: Secondary | ICD-10-CM | POA: Diagnosis not present

## 2017-03-25 DIAGNOSIS — S62351D Nondisplaced fracture of shaft of second metacarpal bone, left hand, subsequent encounter for fracture with routine healing: Secondary | ICD-10-CM | POA: Diagnosis not present

## 2017-03-25 DIAGNOSIS — S62351A Nondisplaced fracture of shaft of second metacarpal bone, left hand, initial encounter for closed fracture: Secondary | ICD-10-CM | POA: Diagnosis not present

## 2017-03-25 DIAGNOSIS — S62353D Nondisplaced fracture of shaft of third metacarpal bone, left hand, subsequent encounter for fracture with routine healing: Secondary | ICD-10-CM | POA: Diagnosis not present

## 2017-03-30 DIAGNOSIS — S62353D Nondisplaced fracture of shaft of third metacarpal bone, left hand, subsequent encounter for fracture with routine healing: Secondary | ICD-10-CM | POA: Diagnosis not present

## 2017-03-30 DIAGNOSIS — M79642 Pain in left hand: Secondary | ICD-10-CM | POA: Diagnosis not present

## 2017-03-30 DIAGNOSIS — S62351D Nondisplaced fracture of shaft of second metacarpal bone, left hand, subsequent encounter for fracture with routine healing: Secondary | ICD-10-CM | POA: Diagnosis not present

## 2017-04-07 ENCOUNTER — Ambulatory Visit
Admission: RE | Admit: 2017-04-07 | Discharge: 2017-04-07 | Disposition: A | Discharge status: Home / Self Care | Payer: 59 | Source: Ambulatory Visit | Attending: Obstetrics and Gynecology | Admitting: Obstetrics and Gynecology

## 2017-04-07 DIAGNOSIS — Z1231 Encounter for screening mammogram for malignant neoplasm of breast: Secondary | ICD-10-CM | POA: Diagnosis not present

## 2017-04-08 DIAGNOSIS — S62353D Nondisplaced fracture of shaft of third metacarpal bone, left hand, subsequent encounter for fracture with routine healing: Secondary | ICD-10-CM | POA: Diagnosis not present

## 2017-04-08 DIAGNOSIS — S62351D Nondisplaced fracture of shaft of second metacarpal bone, left hand, subsequent encounter for fracture with routine healing: Secondary | ICD-10-CM | POA: Diagnosis not present

## 2017-04-08 DIAGNOSIS — M25642 Stiffness of left hand, not elsewhere classified: Secondary | ICD-10-CM | POA: Diagnosis not present

## 2017-04-17 DIAGNOSIS — M25642 Stiffness of left hand, not elsewhere classified: Secondary | ICD-10-CM | POA: Diagnosis not present

## 2017-04-17 DIAGNOSIS — S62351D Nondisplaced fracture of shaft of second metacarpal bone, left hand, subsequent encounter for fracture with routine healing: Secondary | ICD-10-CM | POA: Diagnosis not present

## 2017-04-17 DIAGNOSIS — S62353D Nondisplaced fracture of shaft of third metacarpal bone, left hand, subsequent encounter for fracture with routine healing: Secondary | ICD-10-CM | POA: Diagnosis not present

## 2017-04-24 DIAGNOSIS — E7800 Pure hypercholesterolemia, unspecified: Secondary | ICD-10-CM | POA: Diagnosis not present

## 2017-04-24 DIAGNOSIS — S62351D Nondisplaced fracture of shaft of second metacarpal bone, left hand, subsequent encounter for fracture with routine healing: Secondary | ICD-10-CM | POA: Diagnosis not present

## 2017-04-24 DIAGNOSIS — M79642 Pain in left hand: Secondary | ICD-10-CM | POA: Diagnosis not present

## 2017-04-24 DIAGNOSIS — M25532 Pain in left wrist: Secondary | ICD-10-CM | POA: Diagnosis not present

## 2017-05-01 DIAGNOSIS — M25632 Stiffness of left wrist, not elsewhere classified: Secondary | ICD-10-CM | POA: Diagnosis not present

## 2017-05-01 DIAGNOSIS — M25642 Stiffness of left hand, not elsewhere classified: Secondary | ICD-10-CM | POA: Diagnosis not present

## 2017-05-15 DIAGNOSIS — M25632 Stiffness of left wrist, not elsewhere classified: Secondary | ICD-10-CM | POA: Diagnosis not present

## 2017-05-15 DIAGNOSIS — M79642 Pain in left hand: Secondary | ICD-10-CM | POA: Diagnosis not present

## 2017-05-15 DIAGNOSIS — M25532 Pain in left wrist: Secondary | ICD-10-CM | POA: Diagnosis not present

## 2017-05-19 ENCOUNTER — Encounter: Payer: Self-pay | Admitting: Physician Assistant

## 2017-05-20 DIAGNOSIS — S62353D Nondisplaced fracture of shaft of third metacarpal bone, left hand, subsequent encounter for fracture with routine healing: Secondary | ICD-10-CM | POA: Diagnosis not present

## 2017-05-20 DIAGNOSIS — S62351D Nondisplaced fracture of shaft of second metacarpal bone, left hand, subsequent encounter for fracture with routine healing: Secondary | ICD-10-CM | POA: Diagnosis not present

## 2017-05-28 DIAGNOSIS — R5383 Other fatigue: Secondary | ICD-10-CM | POA: Diagnosis not present

## 2017-05-28 DIAGNOSIS — E559 Vitamin D deficiency, unspecified: Secondary | ICD-10-CM | POA: Diagnosis not present

## 2017-05-28 DIAGNOSIS — N951 Menopausal and female climacteric states: Secondary | ICD-10-CM | POA: Diagnosis not present

## 2017-05-28 DIAGNOSIS — K5900 Constipation, unspecified: Secondary | ICD-10-CM | POA: Diagnosis not present

## 2017-06-02 DIAGNOSIS — Z01419 Encounter for gynecological examination (general) (routine) without abnormal findings: Secondary | ICD-10-CM | POA: Diagnosis not present

## 2017-11-05 DIAGNOSIS — N951 Menopausal and female climacteric states: Secondary | ICD-10-CM | POA: Diagnosis not present

## 2017-11-05 DIAGNOSIS — E039 Hypothyroidism, unspecified: Secondary | ICD-10-CM | POA: Diagnosis not present

## 2017-11-16 DIAGNOSIS — E7212 Methylenetetrahydrofolate reductase deficiency: Secondary | ICD-10-CM | POA: Diagnosis not present

## 2017-11-16 DIAGNOSIS — N951 Menopausal and female climacteric states: Secondary | ICD-10-CM | POA: Diagnosis not present

## 2017-11-16 DIAGNOSIS — K5900 Constipation, unspecified: Secondary | ICD-10-CM | POA: Diagnosis not present

## 2017-11-18 DIAGNOSIS — M654 Radial styloid tenosynovitis [de Quervain]: Secondary | ICD-10-CM | POA: Diagnosis not present

## 2017-12-16 DIAGNOSIS — Z23 Encounter for immunization: Secondary | ICD-10-CM | POA: Diagnosis not present

## 2018-01-20 DIAGNOSIS — Z111 Encounter for screening for respiratory tuberculosis: Secondary | ICD-10-CM | POA: Diagnosis not present

## 2018-01-23 DIAGNOSIS — Z111 Encounter for screening for respiratory tuberculosis: Secondary | ICD-10-CM | POA: Diagnosis not present

## 2018-02-26 ENCOUNTER — Other Ambulatory Visit: Payer: Self-pay | Admitting: Obstetrics and Gynecology

## 2018-02-26 DIAGNOSIS — Z1231 Encounter for screening mammogram for malignant neoplasm of breast: Secondary | ICD-10-CM

## 2018-04-09 ENCOUNTER — Ambulatory Visit: Payer: 59

## 2018-04-30 ENCOUNTER — Ambulatory Visit: Payer: Self-pay

## 2018-05-21 ENCOUNTER — Ambulatory Visit: Payer: Self-pay

## 2018-07-14 ENCOUNTER — Ambulatory Visit
Admission: RE | Admit: 2018-07-14 | Discharge: 2018-07-14 | Disposition: A | Discharge status: Home / Self Care | Payer: PRIVATE HEALTH INSURANCE | Source: Ambulatory Visit | Attending: Obstetrics and Gynecology | Admitting: Obstetrics and Gynecology

## 2018-07-14 ENCOUNTER — Other Ambulatory Visit: Payer: Self-pay

## 2018-07-14 DIAGNOSIS — Z1231 Encounter for screening mammogram for malignant neoplasm of breast: Secondary | ICD-10-CM

## 2019-06-07 ENCOUNTER — Other Ambulatory Visit: Payer: Self-pay | Admitting: Obstetrics and Gynecology

## 2019-06-07 DIAGNOSIS — Z1231 Encounter for screening mammogram for malignant neoplasm of breast: Secondary | ICD-10-CM

## 2019-07-19 ENCOUNTER — Ambulatory Visit
Admission: RE | Admit: 2019-07-19 | Discharge: 2019-07-19 | Disposition: A | Discharge status: Home / Self Care | Payer: PRIVATE HEALTH INSURANCE | Source: Ambulatory Visit | Attending: Obstetrics and Gynecology | Admitting: Obstetrics and Gynecology

## 2019-07-19 ENCOUNTER — Other Ambulatory Visit: Payer: Self-pay

## 2019-07-19 DIAGNOSIS — Z1231 Encounter for screening mammogram for malignant neoplasm of breast: Secondary | ICD-10-CM

## 2019-09-18 IMAGING — MG DIGITAL SCREENING BILATERAL MAMMOGRAM WITH TOMO AND CAD
6 of 9 series · 6 of 25 positions shown · non-contrast
Comparison: Previous exam(s).

CLINICAL DATA: Screening.

EXAM:
DIGITAL SCREENING BILATERAL MAMMOGRAM WITH TOMO AND CAD

[R MLO]
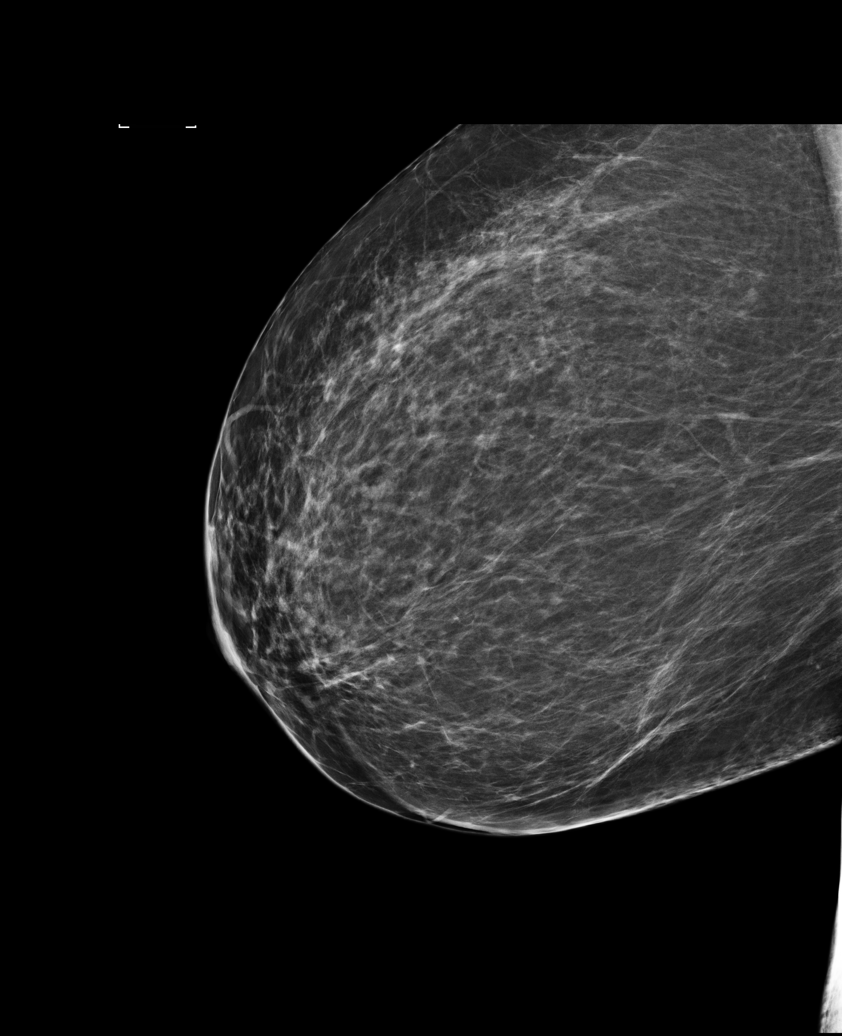

[R CC synth-2D]
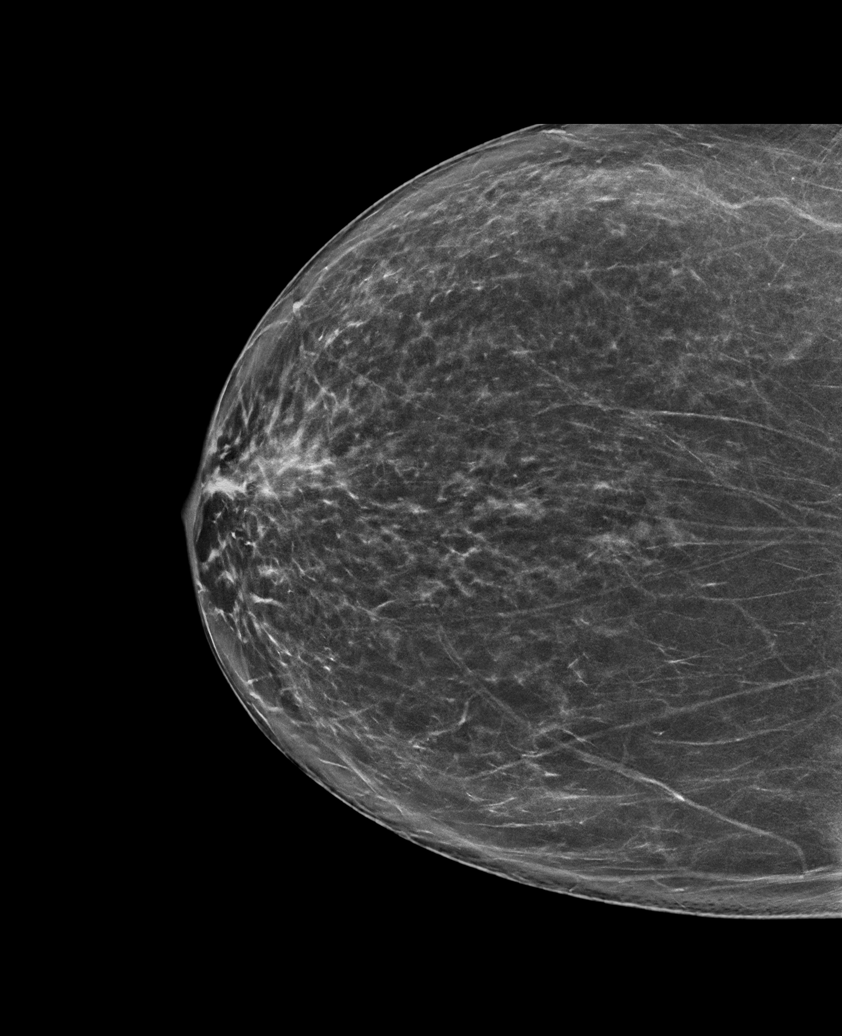

[L MLO synth-2D]
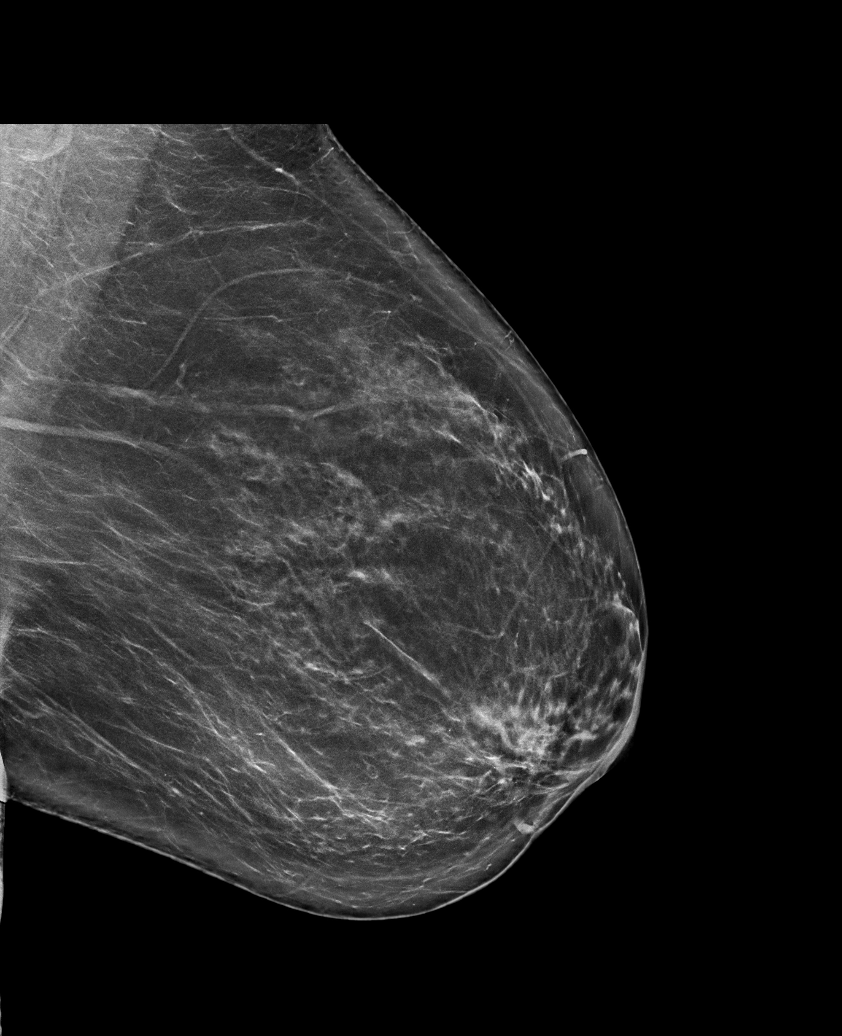

[R MLO synth-2D]
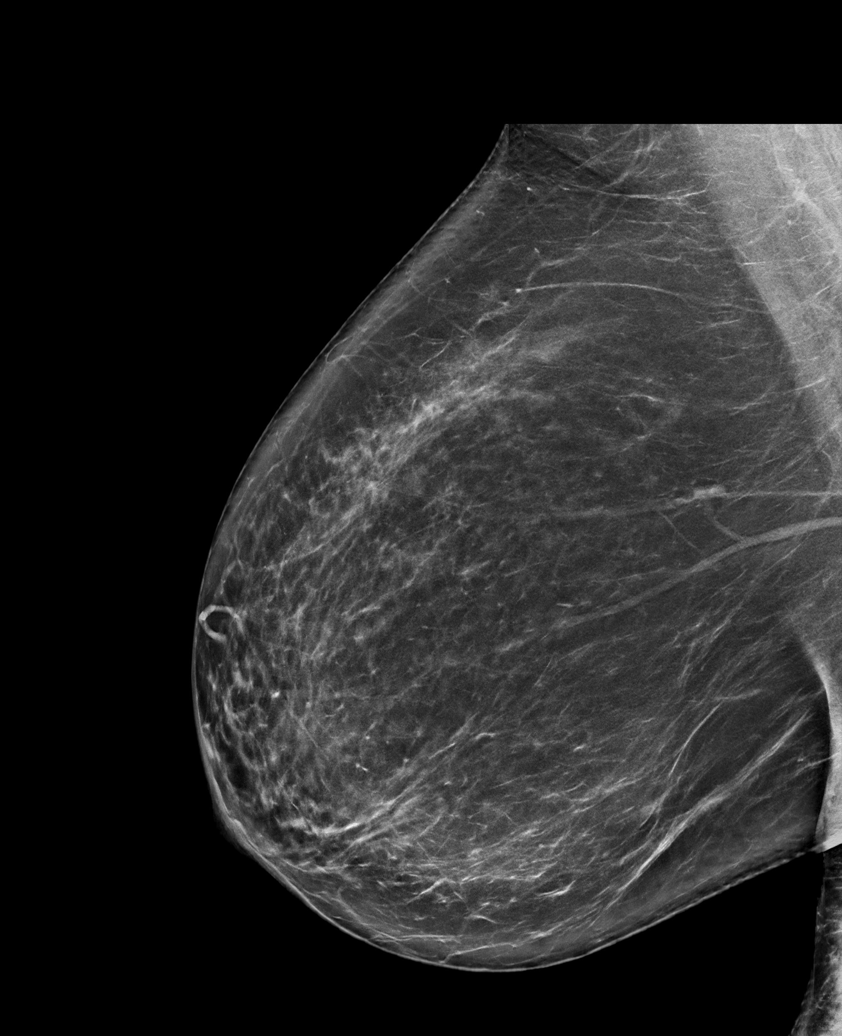

[L CC synth-2D]
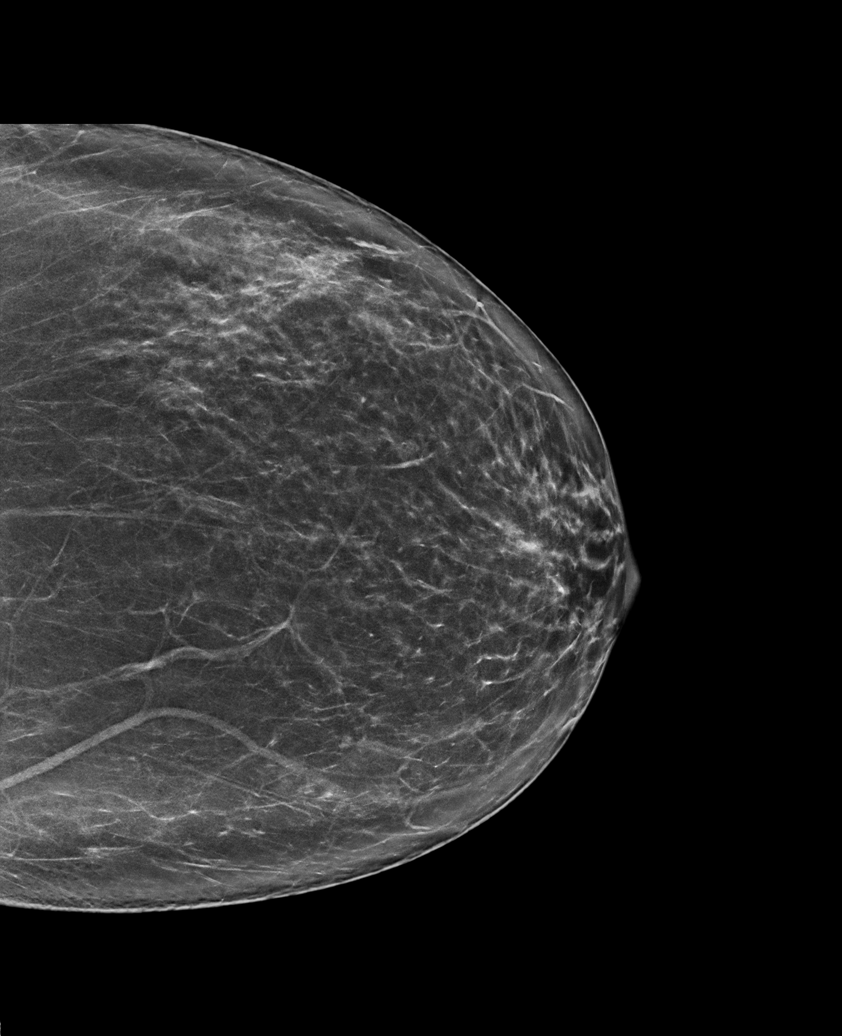

[L MLO tomo · tomo slice 43/85.0]
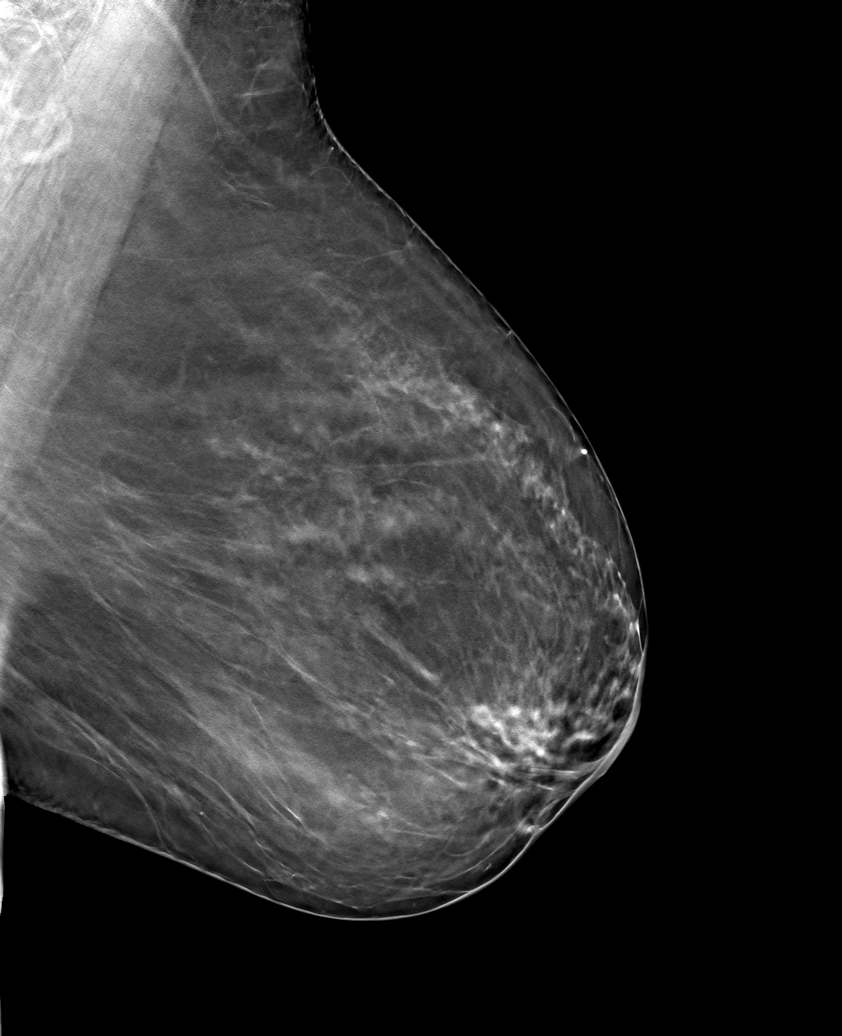

[6 of 25 positions shown; findings below may reference images not displayed]

ACR Breast Density Category b: There are scattered areas of
fibroglandular density.
FINDINGS: There are no findings suspicious for malignancy. Images were
processed with CAD.
IMPRESSION: No mammographic evidence of malignancy. A result letter of this
screening mammogram will be mailed directly to the patient.

RECOMMENDATION:
Screening mammogram in one year. (Code:CN-U-775)

BI-RADS CATEGORY  1: Negative.

## 2020-06-13 ENCOUNTER — Other Ambulatory Visit: Payer: Self-pay | Admitting: Obstetrics and Gynecology

## 2020-06-13 DIAGNOSIS — Z1231 Encounter for screening mammogram for malignant neoplasm of breast: Secondary | ICD-10-CM

## 2020-08-06 ENCOUNTER — Ambulatory Visit: Payer: PRIVATE HEALTH INSURANCE

## 2020-09-20 ENCOUNTER — Other Ambulatory Visit: Payer: Self-pay

## 2020-09-20 ENCOUNTER — Ambulatory Visit
Admission: RE | Admit: 2020-09-20 | Discharge: 2020-09-20 | Disposition: A | Discharge status: Home / Self Care | Payer: No Typology Code available for payment source | Source: Ambulatory Visit | Attending: Obstetrics and Gynecology | Admitting: Obstetrics and Gynecology

## 2020-09-20 DIAGNOSIS — Z1231 Encounter for screening mammogram for malignant neoplasm of breast: Secondary | ICD-10-CM

## 2020-09-26 ENCOUNTER — Other Ambulatory Visit: Payer: Self-pay | Admitting: Obstetrics and Gynecology

## 2020-09-26 DIAGNOSIS — R928 Other abnormal and inconclusive findings on diagnostic imaging of breast: Secondary | ICD-10-CM

## 2020-10-10 ENCOUNTER — Ambulatory Visit
Admission: RE | Admit: 2020-10-10 | Discharge: 2020-10-10 | Disposition: A | Discharge status: Home / Self Care | Payer: No Typology Code available for payment source | Source: Ambulatory Visit | Attending: Obstetrics and Gynecology | Admitting: Obstetrics and Gynecology

## 2020-10-10 ENCOUNTER — Other Ambulatory Visit: Payer: Self-pay

## 2020-10-10 DIAGNOSIS — R928 Other abnormal and inconclusive findings on diagnostic imaging of breast: Secondary | ICD-10-CM

## 2020-12-21 ENCOUNTER — Other Ambulatory Visit: Payer: Self-pay | Admitting: Internal Medicine

## 2020-12-21 DIAGNOSIS — R9389 Abnormal findings on diagnostic imaging of other specified body structures: Secondary | ICD-10-CM

## 2021-01-09 ENCOUNTER — Other Ambulatory Visit: Payer: Self-pay

## 2021-01-09 ENCOUNTER — Ambulatory Visit
Admission: RE | Admit: 2021-01-09 | Discharge: 2021-01-09 | Disposition: A | Discharge status: Home / Self Care | Payer: No Typology Code available for payment source | Source: Ambulatory Visit | Attending: Internal Medicine | Admitting: Internal Medicine

## 2021-01-09 DIAGNOSIS — R9389 Abnormal findings on diagnostic imaging of other specified body structures: Secondary | ICD-10-CM

## 2021-01-09 MED ORDER — IOPAMIDOL (ISOVUE-370) INJECTION 76%
75.0000 mL | Freq: Once | INTRAVENOUS | Status: AC | PRN
  Administered 2021-01-09: 75 mL via INTRAVENOUS

## 2021-08-28 ENCOUNTER — Other Ambulatory Visit: Payer: Self-pay | Admitting: Obstetrics and Gynecology

## 2021-08-28 DIAGNOSIS — Z1231 Encounter for screening mammogram for malignant neoplasm of breast: Secondary | ICD-10-CM

## 2021-10-14 ENCOUNTER — Ambulatory Visit
Admission: RE | Admit: 2021-10-14 | Discharge: 2021-10-14 | Disposition: A | Discharge status: Home / Self Care | Payer: No Typology Code available for payment source | Source: Ambulatory Visit | Attending: Obstetrics and Gynecology | Admitting: Obstetrics and Gynecology

## 2021-10-14 DIAGNOSIS — Z1231 Encounter for screening mammogram for malignant neoplasm of breast: Secondary | ICD-10-CM

## 2022-03-30 NOTE — Progress Notes (Unsigned)
New Patient Visit  LMP 07/12/2012    Subjective:    Patient ID: Kristen Buck, female    DOB: 1965/08/15, 57 y.o.   MRN: [id number]  CC: No chief complaint on file.   HPI: Kristen Buck is a 57 y.o. female presents for new patient visit to establish care.  Introduced to Designer, jewellery role and practice setting.  All questions answered.  Discussed provider/patient relationship and expectations.   Past Medical History:  Diagnosis Date   Anxiety    takes meds   Medical history non-contributory    Vaginal delivery [id number]    Past Surgical History:  Procedure Laterality Date   ABDOMINAL HYSTERECTOMY     COLONOSCOPY WITH PROPOFOL N/A 12/26/2014   Procedure: COLONOSCOPY WITH PROPOFOL;  Surgeon: Garlan Fair, MD;  Location: [facility];  Service: Endoscopy;  Laterality: N/A;   NO PAST SURGERIES     RECTOCELE REPAIR N/A 03/30/2013   Procedure: POSTERIOR REPAIR (RECTOCELE);  Surgeon: Maeola Sarah. Landry Mellow, MD;  Location: [facility];  Service: Gynecology;  Laterality: N/A;   SALPINGOOPHORECTOMY Bilateral 03/30/2013   Procedure: BILATERAL SALPINGO OOPHORECTOMY;  Surgeon: Maeola Sarah. Landry Mellow, MD;  Location: [facility];  Service: Gynecology;  Laterality: Bilateral;   VAGINAL HYSTERECTOMY N/A 03/30/2013   Procedure: HYSTERECTOMY VAGINAL;  Surgeon: Maeola Sarah. Landry Mellow, MD;  Location: [facility];  Service: Gynecology;  Laterality: N/A;    Family History  Problem Relation Age of Onset   Hypertension Mother    Hypercholesterolemia Mother    Hyperlipidemia Mother    Hypertension Father    Hypercholesterolemia Father    Hyperlipidemia Father    Hypertension Maternal Grandfather    Hypertension Paternal Grandfather      Social History   Tobacco Use   Smoking status: Never   Smokeless tobacco: Never  Vaping Use   Vaping Use: Never used  Substance Use Topics   Alcohol use: No   Drug use: No    Current Outpatient Medications on File Prior to Visit  Medication Sig Dispense Refill   calcium-vitamin  D (OSCAL WITH D) 500-200 MG-UNIT per tablet Take 1 tablet by mouth daily with breakfast.     cephALEXin (KEFLEX) 500 MG capsule Take 1 capsule (500 mg total) by mouth 2 (two) times daily. 14 capsule 0   cetirizine (ZYRTEC) 10 MG tablet Take 1 tablet (10 mg total) by mouth daily. 30 tablet 0   cholecalciferol (VITAMIN D) 1000 UNITS tablet Take 4,000 Units by mouth daily.     citalopram (CELEXA) 40 MG tablet Take 40 mg by mouth every morning.      clobetasol cream (TEMOVATE) [id number] % Apply 1 application topically 2 (two) times daily. 30 g 0   ESTRACE VAGINAL 0.1 MG/GM vaginal cream      hydrOXYzine (ATARAX/VISTARIL) 25 MG tablet Take 0.5-1 tablets (12.5-25 mg total) by mouth every 8 (eight) hours as needed for itching. 21 tablet 0   ibuprofen (MOTRIN IB) 200 MG tablet Take 3 tablets (600 mg total) by mouth every 6 (six) hours as needed. 30 tablet 0   magnesium 30 MG tablet Take 30 mg by mouth 2 (two) times daily.     Multiple Vitamin (MULTIVITAMIN WITH MINERALS) TABS tablet Take 1 tablet by mouth daily.     Nutritional Supplements (DHEA PO) Take 1 tablet by mouth daily.     oxyCODONE-acetaminophen (PERCOCET/ROXICET) 5-325 MG tablet Take 1 tablet by mouth every 6 (six) hours as needed for severe pain. 5 tablet 0   ranitidine (ZANTAC) 150  MG tablet Take 1 tablet (150 mg total) by mouth 2 (two) times daily. 30 tablet 0   vitamin C (ASCORBIC ACID) 500 MG tablet Take 1,000 mg by mouth daily.      zinc gluconate 50 MG tablet Take 50 mg by mouth daily.     No current facility-administered medications on file prior to visit.     Review of Systems      Objective:    LMP 07/12/2012   Wt Readings from Last 3 Encounters:  07/04/16 158 lb (71.7 kg)  06/27/16 158 lb (71.7 kg)  12/26/14 155 lb (70.3 kg)    BP Readings from Last 3 Encounters:  02/22/17 140/79  07/04/16 126/82  06/27/16 124/81    Physical Exam     Assessment & Plan:   Problem List Items Addressed This Visit   None    Follow  up plan: No follow-ups on file.

## 2022-03-31 ENCOUNTER — Encounter: Payer: Self-pay | Admitting: Nurse Practitioner

## 2022-03-31 ENCOUNTER — Ambulatory Visit: Payer: No Typology Code available for payment source | Admitting: Nurse Practitioner

## 2022-03-31 VITALS — BP 122/84 | HR 68 | Temp 98.2°F | Ht 63.0 in | Wt 146.6 lb

## 2022-03-31 DIAGNOSIS — K5900 Constipation, unspecified: Secondary | ICD-10-CM

## 2022-03-31 DIAGNOSIS — E559 Vitamin D deficiency, unspecified: Secondary | ICD-10-CM | POA: Diagnosis not present

## 2022-03-31 DIAGNOSIS — Z23 Encounter for immunization: Secondary | ICD-10-CM | POA: Diagnosis not present

## 2022-03-31 DIAGNOSIS — E785 Hyperlipidemia, unspecified: Secondary | ICD-10-CM

## 2022-03-31 DIAGNOSIS — F419 Anxiety disorder, unspecified: Secondary | ICD-10-CM

## 2022-03-31 DIAGNOSIS — Z0000 Encounter for general adult medical examination without abnormal findings: Secondary | ICD-10-CM | POA: Diagnosis not present

## 2022-03-31 MED ORDER — CITALOPRAM HYDROBROMIDE 20 MG PO TABS: 20.0000 mg | ORAL_TABLET | Freq: Every day | ORAL | 3 refills | Status: DC

## 2022-03-31 MED ORDER — ROSUVASTATIN CALCIUM 10 MG PO TABS
10.0000 mg | ORAL_TABLET | Freq: Every day | ORAL | 3 refills | Status: DC
Start: 2022-03-31 — End: 2023-04-23

## 2022-03-31 NOTE — Assessment & Plan Note (Signed)
She is currently taking rosuvastatin 10 mg daily.  Will recheck lipid panel in send a refill to the pharmacy.

## 2022-03-31 NOTE — Assessment & Plan Note (Signed)
Chronic, stable.  Continue Celexa 20 mg daily.  Refill sent to the pharmacy.  Follow-up 1 year.

## 2022-03-31 NOTE — Assessment & Plan Note (Signed)
Chronic, ongoing.  She can continue taking magnesium and Culturelle daily.  She can also try to increase the fiber in her diet or taking a fiber supplement.  Continue drinking plenty of fluids.  She can also try MiraLAX daily as needed.  Follow-up if symptoms worsen or do not improve.

## 2022-03-31 NOTE — Patient Instructions (Signed)
It was great to see you!  I recommend increasing your fiber and/or starting miralax once a day to help with your constipation. Make sure you are drinking plenty of water.   We are checking your labs today and will let you know the results via mychart/phone.   I have refilled your celexa and crestor.   Let's follow-up in 1 year, sooner if you have concerns.  If a referral was placed today, you will be contacted for an appointment. Please note that routine referrals can sometimes take up to 3-4 weeks to process. Please call our office if you haven't heard anything after this time frame.  Take care,  Vance Peper, NP

## 2022-03-31 NOTE — Assessment & Plan Note (Signed)
Will check vitamin D levels and adjust based on results

## 2022-04-01 LAB — COMPREHENSIVE METABOLIC PANEL WITH GFR
ALT: 48 U/L — ABNORMAL HIGH (ref 0–35)
AST: 32 U/L (ref 0–37)
Albumin: 4.3 g/dL (ref 3.5–5.2)
Alkaline Phosphatase: 85 U/L (ref 39–117)
BUN: 18 mg/dL (ref 6–23)
CO2: 30 meq/L (ref 19–32)
Calcium: 9.4 mg/dL (ref 8.4–10.5)
Chloride: 103 meq/L (ref 96–112)
Creatinine, Ser: 0.65 mg/dL (ref 0.40–1.20)
GFR: 97.18 mL/min (ref >60.00)
Glucose, Bld: 89 mg/dL (ref 70–99)
Potassium: 3.7 meq/L (ref 3.5–5.1)
Sodium: 141 meq/L (ref 135–145)
Total Bilirubin: 0.5 mg/dL (ref 0.2–1.2)
Total Protein: 6.1 g/dL (ref 6.0–8.3)

## 2022-04-01 LAB — CBC
HCT: 37.8 % (ref 36.0–46.0)
Hemoglobin: 12.8 g/dL (ref 12.0–15.0)
MCHC: 33.8 g/dL (ref 30.0–36.0)
MCV: 85 fl (ref 78.0–100.0)
Platelets: 211 10*3/uL (ref 150.0–400.0)
RBC: 4.45 Mil/uL (ref 3.87–5.11)
RDW: 13.9 % (ref 11.5–15.5)
WBC: 6.5 10*3/uL (ref 4.0–10.5)

## 2022-04-01 LAB — LIPID PANEL
Cholesterol: 160 mg/dL (ref 0–200)
HDL: 65 mg/dL (ref >39.00)
LDL Cholesterol: 83 mg/dL (ref 0–99)
NonHDL: 95.28
Total CHOL/HDL Ratio: 2
Triglycerides: 61 mg/dL (ref 0.0–149.0)
VLDL: 12.2 mg/dL (ref 0.0–40.0)

## 2022-04-01 LAB — TSH: TSH: 1.6 u[IU]/mL (ref 0.35–5.50)

## 2022-04-01 LAB — VITAMIN D 25 HYDROXY (VIT D DEFICIENCY, FRACTURES): VITD: 80.85 ng/mL (ref 30.00–100.00)

## 2022-05-21 ENCOUNTER — Telehealth: Payer: Self-pay | Admitting: Nurse Practitioner

## 2022-05-21 NOTE — Telephone Encounter (Signed)
Patient requests proof of flu vaccine be emailed to her @ [email or web address]

## 2022-05-21 NOTE — Telephone Encounter (Signed)
I returned patient's call and explained that we can not email Influenza Verification to her but that I can mail or she can stop by and pick up a copy. Patient prefers that I mail a copy to her.

## 2022-09-17 ENCOUNTER — Other Ambulatory Visit: Payer: Self-pay | Admitting: Obstetrics and Gynecology

## 2022-09-17 DIAGNOSIS — Z1231 Encounter for screening mammogram for malignant neoplasm of breast: Secondary | ICD-10-CM

## 2022-10-14 ENCOUNTER — Telehealth: Payer: Self-pay | Admitting: Nurse Practitioner

## 2022-10-14 DIAGNOSIS — R7989 Other specified abnormal findings of blood chemistry: Secondary | ICD-10-CM

## 2022-10-14 DIAGNOSIS — Z23 Encounter for immunization: Secondary | ICD-10-CM

## 2022-10-14 NOTE — Telephone Encounter (Signed)
I called and spoke with patient and notified her that per Lauren said that she does not need to fast for this lab work. Patient was then scheduled for a lab and nurse visit on 10/23/22 at 2:20pm and 2:40pm.

## 2022-10-14 NOTE — Telephone Encounter (Signed)
Pt would like to know if it's time for her 2nd shingles vaccine and she would like a liver enzymes test done. Please advise pt at [phone]

## 2022-10-14 NOTE — Telephone Encounter (Signed)
I called and spoke with patient and notified her of below message and she questions if she needs to be fasting. I told her that I will check with the provider and call her back.

## 2022-10-23 ENCOUNTER — Other Ambulatory Visit: Payer: No Typology Code available for payment source

## 2022-10-23 ENCOUNTER — Ambulatory Visit (INDEPENDENT_AMBULATORY_CARE_PROVIDER_SITE_OTHER): Payer: No Typology Code available for payment source

## 2022-10-23 ENCOUNTER — Ambulatory Visit
Admission: RE | Admit: 2022-10-23 | Discharge: 2022-10-23 | Disposition: A | Discharge status: Home / Self Care | Payer: No Typology Code available for payment source | Source: Ambulatory Visit | Attending: Obstetrics and Gynecology | Admitting: Obstetrics and Gynecology

## 2022-10-23 DIAGNOSIS — Z23 Encounter for immunization: Secondary | ICD-10-CM

## 2022-10-23 DIAGNOSIS — Z1231 Encounter for screening mammogram for malignant neoplasm of breast: Secondary | ICD-10-CM

## 2022-10-23 DIAGNOSIS — R7989 Other specified abnormal findings of blood chemistry: Secondary | ICD-10-CM

## 2022-10-23 NOTE — Progress Notes (Signed)
Per orders of Rodman Pickle, FNP, injection of Shingles vaccine given in LT deltoid by Mickle Plumb, cma.  Patient tolerated injection well.

## 2022-10-24 ENCOUNTER — Encounter: Payer: Self-pay | Admitting: Nurse Practitioner

## 2022-10-24 LAB — COMPREHENSIVE METABOLIC PANEL
ALT: 56 U/L — ABNORMAL HIGH (ref 0–35)
AST: 39 U/L — ABNORMAL HIGH (ref 0–37)
Albumin: 4.1 g/dL (ref 3.5–5.2)
Alkaline Phosphatase: 88 U/L (ref 39–117)
BUN: 22 mg/dL (ref 6–23)
CO2: 30 meq/L (ref 19–32)
Calcium: 9.3 mg/dL (ref 8.4–10.5)
Chloride: 102 meq/L (ref 96–112)
Creatinine, Ser: 0.93 mg/dL (ref 0.40–1.20)
GFR: 67.61 mL/min (ref >60.00)
Glucose, Bld: 87 mg/dL (ref 70–99)
Potassium: 3.6 meq/L (ref 3.5–5.1)
Sodium: 141 mEq/L (ref 135–145)
Total Bilirubin: 0.5 mg/dL (ref 0.2–1.2)
Total Protein: 6.3 g/dL (ref 6.0–8.3)

## 2023-02-20 ENCOUNTER — Other Ambulatory Visit: Payer: Self-pay

## 2023-02-20 DIAGNOSIS — L255 Unspecified contact dermatitis due to plants, except food: Secondary | ICD-10-CM

## 2023-02-20 MED ORDER — ESTRACE 0.1 MG/GM VA CREA: 1.0000 | TOPICAL_CREAM | VAGINAL | 1 refills | Status: DC

## 2023-02-20 NOTE — Telephone Encounter (Signed)
 Requesting: Estradiol Vaginal Insert Last Visit: 03/31/2022 Next Visit: 04/23/2022 Last Refill: 05/22/2016 by Historical Provider  Please Advise

## 2023-02-27 ENCOUNTER — Telehealth: Payer: Self-pay

## 2023-02-27 MED ORDER — ESTRADIOL 10 MCG VA TABS: 1.0000 | ORAL_TABLET | VAGINAL | 3 refills | Status: AC

## 2023-02-27 NOTE — Telephone Encounter (Signed)
 Patient notified that prescription sent to pharmacy for vaginal tablet.

## 2023-02-27 NOTE — Telephone Encounter (Signed)
 I called and spoke with patient and she would like the tablet insert and the cream insert prescribed.

## 2023-02-27 NOTE — Telephone Encounter (Signed)
 Copied from CRM [id number]. Topic: Clinical - Request for Lab/Test Order >> Feb 27, 2023 10:52 AM Kristen Buck wrote: Reason for CRM: Patient would like know if she needs to get lab work done on 3/6 or before appointment for Liver enzymes and cholesterol / please call [phone]

## 2023-02-27 NOTE — Telephone Encounter (Signed)
 Copied from CRM [id number]. Topic: Clinical - Medication Question >> Feb 27, 2023 10:54 AM Kristen Buck wrote: Reason for CRM: Patient would also like to know if medication Estradiol  10mcg Vaginal Insert is actual inserts or cream due to it sayin cream on mychart & if not, she would like Vaginal inserts (tablets)

## 2023-02-27 NOTE — Telephone Encounter (Signed)
 I called and spoke with patient and notified her that labs can be done at day of visit.

## 2023-04-23 ENCOUNTER — Ambulatory Visit: Payer: No Typology Code available for payment source | Admitting: Nurse Practitioner

## 2023-04-23 ENCOUNTER — Encounter: Payer: Self-pay | Admitting: Nurse Practitioner

## 2023-04-23 VITALS — BP 142/90 | HR 61 | Temp 96.8°F | Ht 63.0 in | Wt 151.6 lb

## 2023-04-23 DIAGNOSIS — R29898 Other symptoms and signs involving the musculoskeletal system: Secondary | ICD-10-CM

## 2023-04-23 DIAGNOSIS — R350 Frequency of micturition: Secondary | ICD-10-CM

## 2023-04-23 DIAGNOSIS — F419 Anxiety disorder, unspecified: Secondary | ICD-10-CM | POA: Diagnosis not present

## 2023-04-23 DIAGNOSIS — E559 Vitamin D deficiency, unspecified: Secondary | ICD-10-CM | POA: Diagnosis not present

## 2023-04-23 DIAGNOSIS — R030 Elevated blood-pressure reading, without diagnosis of hypertension: Secondary | ICD-10-CM

## 2023-04-23 DIAGNOSIS — E785 Hyperlipidemia, unspecified: Secondary | ICD-10-CM | POA: Diagnosis not present

## 2023-04-23 MED ORDER — ROSUVASTATIN CALCIUM 10 MG PO TABS: ORAL_TABLET | ORAL | 3 refills | Status: AC

## 2023-04-23 MED ORDER — CITALOPRAM HYDROBROMIDE 20 MG PO TABS: 20.0000 mg | ORAL_TABLET | Freq: Every day | ORAL | 3 refills | Status: AC

## 2023-04-23 NOTE — Assessment & Plan Note (Signed)
 She reports occasional weakness and mild pain in the left wrist. Phalen's test negative. Provide wrist exercises to strengthen the wrist and recommend wearing a wrist brace at night.

## 2023-04-23 NOTE — Assessment & Plan Note (Signed)
Will check vitamin D levels and adjust based on results

## 2023-04-23 NOTE — Patient Instructions (Signed)
 It was great to see you!  Start checking your blood pressure at home and writing it down  Limit the amount of salt in your diet  I have refilled your meds  We are checking your labs today and will let you know the results via mychart/phone.   Let's follow-up in 2-3 months, sooner if you have concerns.  If a referral was placed today, you will be contacted for an appointment. Please note that routine referrals can sometimes take up to 3-4 weeks to process. Please call our office if you haven't heard anything after this time frame.  Take care,  Rodman Pickle, NP

## 2023-04-23 NOTE — Assessment & Plan Note (Signed)
 She is currently taking rosuvastatin 10 mg 4 times per week.  Will check lipid panel, CMP, CBC, and send a refill to the pharmacy.

## 2023-04-23 NOTE — Assessment & Plan Note (Signed)
Chronic, stable.  Continue Celexa 20 mg daily.  Refill sent to the pharmacy.  Follow-up 1 year.

## 2023-04-23 NOTE — Assessment & Plan Note (Signed)
 She experiences frequent nocturnal urination with increased thirst and daytime frequency, suggesting an overactive bladder. Check A1c today. Advise reducing fluid intake after dinner and suggest chewing ice chips or sucking on hard candy to manage dry mouth and reduce nighttime urination.

## 2023-04-23 NOTE — Progress Notes (Signed)
 Established Patient Office Visit  Subjective   Patient ID: Kristen Buck, female    DOB: [birth year]  Age: 58 y.o. MRN: [id number]  Chief Complaint  Patient presents with   Medication Management    Rx Refill, no concerns    HPI  Discussed the use of AI scribe software for clinical note transcription with the patient, who gave verbal consent to proceed.  History of Present Illness   The patient, with a history of hyperlipidemia and anxiety, presents with increased frequency of urination, particularly at night. She reports waking up four to five times in one night, but typically urinates once or twice per night. She also notes increased thirst and frequent urination during the day. She denies dysuria and reports normal urine volume. She has been drinking a lot of water, particularly in the evening.  The patient also reports a history of a broken hand and is currently experiencing occasional weakness in the same wrist, causing her to drop things unexpectedly. She denies pain, swelling, and difficulty with tasks such as opening jars.  She is currently taking Celexa for anxiety and rosuvastatin, which she has recently reduced in frequency to four times per week. She also takes CoQ10 to support liver health and a vitamin D supplement. She uses Miralax as needed for regularity. She denies chest pain and shortness of breath. Her mood is stable and she needs a refill today.        04/23/2023    3:19 PM 04/23/2023    2:43 PM 03/31/2022    4:27 PM 07/04/2016    8:20 AM 06/27/2016    9:28 AM  Depression screen PHQ 2/9  Decreased Interest 0 3 0 0 0  Down, Depressed, Hopeless 0 0 0 0 0  PHQ - 2 Score 0 3 0 0 0  Altered sleeping 0  0    Tired, decreased energy 0  2    Change in appetite 0  0    Feeling bad or failure about yourself  0  0    Trouble concentrating 0  0    Moving slowly or fidgety/restless 0  0    Suicidal thoughts 0  0    PHQ-9 Score 0  2    Difficult doing work/chores   Not  difficult at all        04/23/2023    3:20 PM 03/31/2022    4:27 PM  GAD 7 : Generalized Anxiety Score  Nervous, Anxious, on Edge 0 0  Control/stop worrying 0 0  Worry too much - different things 0 0  Trouble relaxing 0 0  Restless 0 0  Easily annoyed or irritable 0 0  Afraid - awful might happen 0 0  Total GAD 7 Score 0 0  Anxiety Difficulty  Not difficult at all      ROS See pertinent positives and negatives per HPI.    Objective:     BP (!) 142/90 (BP Location: Left Arm, Cuff Size: Normal)   Pulse 61   Temp (!) 96.8 F (36 C)   Ht 5\' 3"  (1.6 m)   Wt 151 lb 9.6 oz (68.8 kg)   LMP 07/12/2012   SpO2 97%   BMI 26.85 kg/m    Physical Exam Vitals and nursing note reviewed.  Constitutional:      General: She is not in acute distress.    Appearance: Normal appearance.  HENT:     Head: Normocephalic.  Eyes:     Conjunctiva/sclera: Conjunctivae normal.  Cardiovascular:     Rate and Rhythm: Normal rate and regular rhythm.     Pulses: Normal pulses.     Heart sounds: Normal heart sounds.  Pulmonary:     Effort: Pulmonary effort is normal.     Breath sounds: Normal breath sounds.  Musculoskeletal:        General: No tenderness.     Cervical back: Normal range of motion.     Comments: Phalen's test negative.  Skin:    General: Skin is warm.  Neurological:     General: No focal deficit present.     Mental Status: She is alert and oriented to person, place, and time.  Psychiatric:        Mood and Affect: Mood normal.        Behavior: Behavior normal.        Thought Content: Thought content normal.        Judgment: Judgment normal.    The 10-year ASCVD risk score (Arnett DK, et al., 2019) is: 2.7%    Assessment & Plan:   Problem List Items Addressed This Visit       Other   Hyperlipidemia - Primary   She is currently taking rosuvastatin 10 mg 4 times per week.  Will check lipid panel, CMP, CBC, and send a refill to the pharmacy.      Relevant  Medications   rosuvastatin (CRESTOR) 10 MG tablet   Other Relevant Orders   CBC with Differential/Platelet   Comprehensive metabolic panel   Lipid panel   Vitamin D deficiency   Will check vitamin D levels and adjust based on results      Relevant Orders   VITAMIN D 25 Hydroxy (Vit-D Deficiency, Fractures)   Anxiety   Chronic, stable.  Continue Celexa 20 mg daily.  Refill sent to the pharmacy.  Follow-up 1 year.      Relevant Medications   citalopram (CELEXA) 20 MG tablet   Elevated blood pressure reading   Her blood pressure is elevated at 142/90 mmHg, with a family history of hypertension. Lifestyle modifications are advised. Recommend home blood pressure monitoring or using store machines. Advise limiting salt intake and avoiding salty foods like soups, frozen meals, deli meats, hot dogs, chips, and pretzels. Encourage regular exercise. Follow up in 2-3 months to recheck blood pressure and labs, sooner if blood pressure remains high.      Urinary frequency   She experiences frequent nocturnal urination with increased thirst and daytime frequency, suggesting an overactive bladder. Check A1c today. Advise reducing fluid intake after dinner and suggest chewing ice chips or sucking on hard candy to manage dry mouth and reduce nighttime urination.      Relevant Orders   Hemoglobin A1c   Wrist weakness   She reports occasional weakness and mild pain in the left wrist. Phalen's test negative. Provide wrist exercises to strengthen the wrist and recommend wearing a wrist brace at night.        No follow-ups on file.    Gerre Scull, NP

## 2023-04-23 NOTE — Assessment & Plan Note (Signed)
 Her blood pressure is elevated at 142/90 mmHg, with a family history of hypertension. Lifestyle modifications are advised. Recommend home blood pressure monitoring or using store machines. Advise limiting salt intake and avoiding salty foods like soups, frozen meals, deli meats, hot dogs, chips, and pretzels. Encourage regular exercise. Follow up in 2-3 months to recheck blood pressure and labs, sooner if blood pressure remains high.

## 2023-04-24 ENCOUNTER — Encounter: Payer: Self-pay | Admitting: Nurse Practitioner

## 2023-04-24 LAB — CBC WITH DIFFERENTIAL/PLATELET
Basophils Absolute: 0.1 10*3/uL (ref 0.0–0.1)
Basophils Relative: 0.9 % (ref 0.0–3.0)
Eosinophils Absolute: 0.1 10*3/uL (ref 0.0–0.7)
Eosinophils Relative: 1.8 % (ref 0.0–5.0)
HCT: 40.5 % (ref 36.0–46.0)
Hemoglobin: 13.5 g/dL (ref 12.0–15.0)
Lymphocytes Relative: 44.6 % (ref 12.0–46.0)
Lymphs Abs: 2.7 10*3/uL (ref 0.7–4.0)
MCHC: 33.3 g/dL (ref 30.0–36.0)
MCV: 85.5 fl (ref 78.0–100.0)
Monocytes Absolute: 0.6 10*3/uL (ref 0.1–1.0)
Monocytes Relative: 9.1 % (ref 3.0–12.0)
Neutro Abs: 2.6 10*3/uL (ref 1.4–7.7)
Neutrophils Relative %: 43.6 % (ref 43.0–77.0)
Platelets: 229 10*3/uL (ref 150.0–400.0)
RBC: 4.73 Mil/uL (ref 3.87–5.11)
RDW: 13.5 % (ref 11.5–15.5)
WBC: 6 10*3/uL (ref 4.0–10.5)

## 2023-04-24 LAB — COMPREHENSIVE METABOLIC PANEL
ALT: 33 U/L (ref 0–35)
AST: 26 U/L (ref 0–37)
Albumin: 4.6 g/dL (ref 3.5–5.2)
Alkaline Phosphatase: 83 U/L (ref 39–117)
BUN: 21 mg/dL (ref 6–23)
CO2: 29 meq/L (ref 19–32)
Calcium: 9.7 mg/dL (ref 8.4–10.5)
Chloride: 103 meq/L (ref 96–112)
Creatinine, Ser: 0.66 mg/dL (ref 0.40–1.20)
GFR: 96.1 mL/min (ref >60.00)
Glucose, Bld: 100 mg/dL — ABNORMAL HIGH (ref 70–99)
Potassium: 4 meq/L (ref 3.5–5.1)
Sodium: 143 meq/L (ref 135–145)
Total Bilirubin: 0.4 mg/dL (ref 0.2–1.2)
Total Protein: 6.7 g/dL (ref 6.0–8.3)

## 2023-04-24 LAB — LIPID PANEL
Cholesterol: 189 mg/dL (ref 0–200)
HDL: 67 mg/dL (ref >39.00)
LDL Cholesterol: 99 mg/dL (ref 0–99)
NonHDL: 121.98
Total CHOL/HDL Ratio: 3
Triglycerides: 113 mg/dL (ref 0.0–149.0)
VLDL: 22.6 mg/dL (ref 0.0–40.0)

## 2023-04-24 LAB — VITAMIN D 25 HYDROXY (VIT D DEFICIENCY, FRACTURES): VITD: 61.5 ng/mL (ref 30.00–100.00)

## 2023-04-24 LAB — HEMOGLOBIN A1C: Hgb A1c MFr Bld: 5.5 % (ref 4.6–6.5)

## 2023-07-29 ENCOUNTER — Ambulatory Visit: Admitting: Nurse Practitioner

## 2023-08-12 ENCOUNTER — Encounter: Payer: Self-pay | Admitting: Nurse Practitioner

## 2023-08-12 ENCOUNTER — Ambulatory Visit: Admitting: Nurse Practitioner

## 2023-08-12 VITALS — BP 114/84 | HR 67 | Ht 63.0 in | Wt 150.8 lb

## 2023-08-12 DIAGNOSIS — R030 Elevated blood-pressure reading, without diagnosis of hypertension: Secondary | ICD-10-CM

## 2023-08-12 DIAGNOSIS — R29898 Other symptoms and signs involving the musculoskeletal system: Secondary | ICD-10-CM | POA: Diagnosis not present

## 2023-08-12 DIAGNOSIS — R350 Frequency of micturition: Secondary | ICD-10-CM

## 2023-08-12 DIAGNOSIS — R42 Dizziness and giddiness: Secondary | ICD-10-CM | POA: Diagnosis not present

## 2023-08-12 LAB — COMPREHENSIVE METABOLIC PANEL WITH GFR
ALT: 40 U/L — ABNORMAL HIGH (ref 0–35)
AST: 28 U/L (ref 0–37)
Albumin: 4.3 g/dL (ref 3.5–5.2)
Alkaline Phosphatase: 73 U/L (ref 39–117)
BUN: 17 mg/dL (ref 6–23)
CO2: 32 meq/L (ref 19–32)
Calcium: 9.2 mg/dL (ref 8.4–10.5)
Chloride: 103 meq/L (ref 96–112)
Creatinine, Ser: 0.71 mg/dL (ref 0.40–1.20)
GFR: 92.95 mL/min (ref >60.00)
Glucose, Bld: 87 mg/dL (ref 70–99)
Potassium: 3.7 meq/L (ref 3.5–5.1)
Sodium: 140 meq/L (ref 135–145)
Total Bilirubin: 0.5 mg/dL (ref 0.2–1.2)
Total Protein: 6.4 g/dL (ref 6.0–8.3)

## 2023-08-12 LAB — CBC WITH DIFFERENTIAL/PLATELET
Basophils Absolute: 0 10*3/uL (ref 0.0–0.1)
Basophils Relative: 1 % (ref 0.0–3.0)
Eosinophils Absolute: 0.1 10*3/uL (ref 0.0–0.7)
Eosinophils Relative: 2.2 % (ref 0.0–5.0)
HCT: 38.4 % (ref 36.0–46.0)
Hemoglobin: 13 g/dL (ref 12.0–15.0)
Lymphocytes Relative: 43.9 % (ref 12.0–46.0)
Lymphs Abs: 2 10*3/uL (ref 0.7–4.0)
MCHC: 34 g/dL (ref 30.0–36.0)
MCV: 83.1 fl (ref 78.0–100.0)
Monocytes Absolute: 0.4 10*3/uL (ref 0.1–1.0)
Monocytes Relative: 8.5 % (ref 3.0–12.0)
Neutro Abs: 2 10*3/uL (ref 1.4–7.7)
Neutrophils Relative %: 44.4 % (ref 43.0–77.0)
Platelets: 199 10*3/uL (ref 150.0–400.0)
RBC: 4.61 Mil/uL (ref 3.87–5.11)
RDW: 13.7 % (ref 11.5–15.5)
WBC: 4.5 10*3/uL (ref 4.0–10.5)

## 2023-08-12 LAB — TSH: TSH: 1.9 u[IU]/mL (ref 0.35–5.50)

## 2023-08-12 NOTE — Patient Instructions (Signed)
 It was great to see you!  Keep drinking water, but add in a gatorade or Liquid IV or propel once a day or every other day   Start ankle wrist stretches - spell alphabet out   Start kegel exercises   Let's follow-up in 8 months, sooner if you have concerns.  If a referral was placed today, you will be contacted for an appointment. Please note that routine referrals can sometimes take up to 3-4 weeks to process. Please call our office if you haven't heard anything after this time frame.  Take care,  Tinnie Harada, NP

## 2023-08-12 NOTE — Assessment & Plan Note (Signed)
 She experiences occasional giving way of the right ankle and left wrist. Recommend strengthening exercises, such as spelling the alphabet with the foot and wrist. Consider referral to orthopedics if symptoms persist or worsen.

## 2023-08-12 NOTE — Assessment & Plan Note (Signed)
 She experiences intermittent dizziness upon waking in the morning. Advise her to change positions slowly, especially in the morning, and recommend electrolyte supplementation with products like Gatorade, Liquid IV, or Propel once a day or every other day in addition to the water she is drinking. Check CMP, CBC, TSH today.

## 2023-08-12 NOTE — Assessment & Plan Note (Signed)
 Blood pressure is back down to normal at 114/84. Continue checking blood pressure intermittently at home and limiting salt in her diet.

## 2023-08-12 NOTE — Assessment & Plan Note (Signed)
 She has frequent urination with small volumes, without dysuria or UTI symptoms. Preferring non-pharmacological interventions, she should perform Kegel exercises to strengthen pelvic floor muscles, reduce caffeine and carbonated beverage intake, and drink small amounts of fluid throughout the day rather than large amounts at once.

## 2023-08-12 NOTE — Progress Notes (Signed)
 Established Patient Office Visit  Subjective   Patient ID: Kristen Buck, female    DOB: 03/20/1965  Age: 58 y.o. MRN: [id number]  Chief Complaint  Patient presents with   Hyperlipidemia    Follow up , blood pressure was elevated last visit, no concerns    HPI Discussed the use of AI scribe software for clinical note transcription with the patient, who gave verbal consent to proceed.  History of Present Illness   Kristen Buck is a 58 year old female who presents with dizziness and overactive bladder symptoms.  Dizziness occurs primarily in the mornings upon waking, starting about a month ago, described as feeling 'weird' and 'not quite stable.' It occurs almost every morning but is not severe enough to cause fainting and sometimes persists throughout the day. She checked her blood pressure for a few weeks, but has not recently. She states that she drinks plenty of water. No chest pain, shortness of breath, or headaches.  She urinates frequently, approximately every two hours, often in small amounts, with no burning sensation. A urinary tract infection occurred a few months ago. She drinks a lot of water. She does not smoke and drinks 1 caffeinated drink per day. She also does not drink soda.   Occasional weakness in the right ankle and left wrist occurs, sometimes causing her to 'give way' without warning, with brief pain. There is a history of a previous fracture in the left wrist.       ROS See pertinent positives and negatives per HPI.    Objective:     BP 114/84 (BP Location: Left Arm, Patient Position: Sitting, Cuff Size: Normal)   Pulse 67   Ht 5' 3 (1.6 m)   Wt 150 lb 12.8 oz (68.4 kg)   LMP 07/12/2012   SpO2 98%   BMI 26.71 kg/m  BP Readings from Last 3 Encounters:  08/12/23 114/84  04/23/23 (!) 142/90  03/31/22 122/84   Wt Readings from Last 3 Encounters:  08/12/23 150 lb 12.8 oz (68.4 kg)  04/23/23 151 lb 9.6 oz (68.8 kg)  03/31/22 146 lb 9.6 oz  (66.5 kg)      Physical Exam Vitals and nursing note reviewed.  Constitutional:      General: She is not in acute distress.    Appearance: Normal appearance.  HENT:     Head: Normocephalic.   Eyes:     Conjunctiva/sclera: Conjunctivae normal.    Cardiovascular:     Rate and Rhythm: Normal rate and regular rhythm.     Pulses: Normal pulses.     Heart sounds: Normal heart sounds.  Pulmonary:     Effort: Pulmonary effort is normal.     Breath sounds: Normal breath sounds.   Musculoskeletal:     Cervical back: Normal range of motion.   Skin:    General: Skin is warm.   Neurological:     General: No focal deficit present.     Mental Status: She is alert and oriented to person, place, and time.   Psychiatric:        Mood and Affect: Mood normal.        Behavior: Behavior normal.        Thought Content: Thought content normal.        Judgment: Judgment normal.    The 10-year ASCVD risk score (Arnett DK, et al., 2019) is: 2%    Assessment & Plan:   Problem List Items Addressed This Visit  Other   Elevated blood pressure reading   Blood pressure is back down to normal at 114/84. Continue checking blood pressure intermittently at home and limiting salt in her diet.       Relevant Orders   CBC with Differential/Platelet   Comprehensive metabolic panel with GFR   TSH   Urinary frequency   She has frequent urination with small volumes, without dysuria or UTI symptoms. Preferring non-pharmacological interventions, she should perform Kegel exercises to strengthen pelvic floor muscles, reduce caffeine and carbonated beverage intake, and drink small amounts of fluid throughout the day rather than large amounts at once.      Relevant Orders   Comprehensive metabolic panel with GFR   Wrist weakness   She experiences occasional giving way of the right ankle and left wrist. Recommend strengthening exercises, such as spelling the alphabet with the foot and wrist.  Consider referral to orthopedics if symptoms persist or worsen.       Dizziness - Primary   She experiences intermittent dizziness upon waking in the morning. Advise her to change positions slowly, especially in the morning, and recommend electrolyte supplementation with products like Gatorade, Liquid IV, or Propel once a day or every other day in addition to the water she is drinking. Check CMP, CBC, TSH today.       Relevant Orders   CBC with Differential/Platelet   Comprehensive metabolic panel with GFR   TSH    Return in about 8 months (around 04/13/2024) for CPE.    Tinnie DELENA Harada, NP

## 2023-08-13 ENCOUNTER — Ambulatory Visit: Payer: Self-pay | Admitting: Nurse Practitioner

## 2023-08-13 DIAGNOSIS — R7989 Other specified abnormal findings of blood chemistry: Secondary | ICD-10-CM

## 2023-08-17 ENCOUNTER — Telehealth: Payer: Self-pay | Admitting: *Deleted

## 2023-08-17 NOTE — Telephone Encounter (Signed)
 Copied from CRM [id number]. Topic: General - Call Back - No Documentation >> Aug 17, 2023 11:52 AM [name] wrote: Reason for CRM: Patient is returning a missed call from the clinic. I couldn't find who called and the reason for the call, but, I did confirm that the patient received her lab results and has no further questions at this time.

## 2023-08-17 NOTE — Telephone Encounter (Signed)
 Noted

## 2023-08-19 ENCOUNTER — Ambulatory Visit
Admission: RE | Admit: 2023-08-19 | Discharge: 2023-08-19 | Disposition: A | Discharge status: Home / Self Care | Source: Ambulatory Visit | Attending: Nurse Practitioner | Admitting: Nurse Practitioner

## 2023-08-19 ENCOUNTER — Ambulatory Visit: Payer: Self-pay | Admitting: Nurse Practitioner

## 2023-08-19 DIAGNOSIS — R7989 Other specified abnormal findings of blood chemistry: Secondary | ICD-10-CM

## 2023-09-09 ENCOUNTER — Other Ambulatory Visit: Payer: Self-pay | Admitting: Obstetrics and Gynecology

## 2023-09-09 DIAGNOSIS — Z1231 Encounter for screening mammogram for malignant neoplasm of breast: Secondary | ICD-10-CM

## 2023-10-26 ENCOUNTER — Ambulatory Visit
Admission: RE | Admit: 2023-10-26 | Discharge: 2023-10-26 | Disposition: A | Discharge status: Home / Self Care | Source: Ambulatory Visit | Attending: Obstetrics and Gynecology | Admitting: Obstetrics and Gynecology

## 2023-10-26 DIAGNOSIS — Z1231 Encounter for screening mammogram for malignant neoplasm of breast: Secondary | ICD-10-CM

## 2024-04-13 ENCOUNTER — Encounter: Admitting: Nurse Practitioner
